# Patient Record
Sex: Male | Born: 1955 | Race: White | Hispanic: No | Marital: Married | Smoking: Never smoker
Health system: Southern US, Community
[De-identification: ages and names within clinical notes are randomized; demographics above are authoritative.]

## PROBLEM LIST (undated history)

## (undated) DIAGNOSIS — E8881 Metabolic syndrome: Secondary | ICD-10-CM

## (undated) DIAGNOSIS — G4730 Sleep apnea, unspecified: Secondary | ICD-10-CM

## (undated) DIAGNOSIS — E785 Hyperlipidemia, unspecified: Secondary | ICD-10-CM

## (undated) DIAGNOSIS — G4733 Obstructive sleep apnea (adult) (pediatric): Secondary | ICD-10-CM

## (undated) DIAGNOSIS — R0600 Dyspnea, unspecified: Secondary | ICD-10-CM

## (undated) DIAGNOSIS — I10 Essential (primary) hypertension: Secondary | ICD-10-CM

## (undated) HISTORY — DX: Metabolic syndrome: E88.81

## (undated) HISTORY — DX: Metabolic syndrome: E88.810

## (undated) HISTORY — DX: Obstructive sleep apnea (adult) (pediatric): G47.33

## (undated) HISTORY — DX: Hyperlipidemia, unspecified: E78.5

## (undated) HISTORY — DX: Essential (primary) hypertension: I10

## (undated) HISTORY — DX: Sleep apnea, unspecified: G47.30

## (undated) HISTORY — DX: Dyspnea, unspecified: R06.00

---

## 1990-02-20 HISTORY — PX: SPINE SURGERY: SHX786

## 1990-02-20 HISTORY — PX: BACK SURGERY: SHX140

## 2005-12-06 ENCOUNTER — Ambulatory Visit: Payer: Self-pay | Admitting: Family Medicine

## 2005-12-13 ENCOUNTER — Ambulatory Visit: Payer: Self-pay | Admitting: Family Medicine

## 2006-02-28 ENCOUNTER — Ambulatory Visit: Payer: Self-pay | Admitting: Family Medicine

## 2007-08-07 ENCOUNTER — Ambulatory Visit: Payer: Self-pay | Admitting: Family Medicine

## 2007-08-14 ENCOUNTER — Ambulatory Visit: Payer: Self-pay | Admitting: Gastroenterology

## 2007-08-28 ENCOUNTER — Ambulatory Visit: Payer: Self-pay | Admitting: Gastroenterology

## 2010-03-30 ENCOUNTER — Institutional Professional Consult (permissible substitution) (INDEPENDENT_AMBULATORY_CARE_PROVIDER_SITE_OTHER): Payer: BC Managed Care – PPO | Admitting: Cardiovascular Disease

## 2010-03-30 DIAGNOSIS — G4700 Insomnia, unspecified: Secondary | ICD-10-CM

## 2010-03-30 DIAGNOSIS — G4730 Sleep apnea, unspecified: Secondary | ICD-10-CM

## 2010-03-30 DIAGNOSIS — I119 Hypertensive heart disease without heart failure: Secondary | ICD-10-CM

## 2010-05-03 ENCOUNTER — Ambulatory Visit (HOSPITAL_BASED_OUTPATIENT_CLINIC_OR_DEPARTMENT_OTHER): Payer: BC Managed Care – PPO | Attending: Cardiovascular Disease

## 2010-05-03 DIAGNOSIS — G4710 Hypersomnia, unspecified: Secondary | ICD-10-CM | POA: Insufficient documentation

## 2010-05-03 DIAGNOSIS — G4733 Obstructive sleep apnea (adult) (pediatric): Secondary | ICD-10-CM | POA: Insufficient documentation

## 2010-05-03 DIAGNOSIS — R0989 Other specified symptoms and signs involving the circulatory and respiratory systems: Secondary | ICD-10-CM | POA: Insufficient documentation

## 2010-05-03 DIAGNOSIS — R0609 Other forms of dyspnea: Secondary | ICD-10-CM | POA: Insufficient documentation

## 2010-05-03 DIAGNOSIS — I4949 Other premature depolarization: Secondary | ICD-10-CM | POA: Insufficient documentation

## 2010-05-07 DIAGNOSIS — G4733 Obstructive sleep apnea (adult) (pediatric): Secondary | ICD-10-CM

## 2010-05-07 DIAGNOSIS — R0989 Other specified symptoms and signs involving the circulatory and respiratory systems: Secondary | ICD-10-CM

## 2010-05-07 DIAGNOSIS — G4710 Hypersomnia, unspecified: Secondary | ICD-10-CM

## 2010-05-07 DIAGNOSIS — R0609 Other forms of dyspnea: Secondary | ICD-10-CM

## 2010-06-01 ENCOUNTER — Telehealth: Payer: Self-pay | Admitting: Cardiovascular Disease

## 2010-06-01 NOTE — Telephone Encounter (Signed)
CALL PT REGARDING A TEST HE HAD DONE.

## 2010-06-02 ENCOUNTER — Telehealth: Payer: Self-pay | Admitting: Cardiovascular Disease

## 2010-06-02 NOTE — Telephone Encounter (Signed)
Pt called with app for F/U to abn sleep study results, study faxed to Dr Shelle Iron for app 06/29/10 11:30 am.Jodette Mason Jim RN

## 2010-06-23 ENCOUNTER — Encounter: Payer: Self-pay | Admitting: Cardiovascular Disease

## 2010-06-23 DIAGNOSIS — R0600 Dyspnea, unspecified: Secondary | ICD-10-CM | POA: Insufficient documentation

## 2010-06-23 DIAGNOSIS — I10 Essential (primary) hypertension: Secondary | ICD-10-CM | POA: Insufficient documentation

## 2010-06-23 DIAGNOSIS — R079 Chest pain, unspecified: Secondary | ICD-10-CM | POA: Insufficient documentation

## 2010-06-29 ENCOUNTER — Encounter: Payer: Self-pay | Admitting: Cardiovascular Disease

## 2010-06-29 ENCOUNTER — Ambulatory Visit (INDEPENDENT_AMBULATORY_CARE_PROVIDER_SITE_OTHER): Payer: BC Managed Care – PPO | Admitting: Pulmonary Disease

## 2010-06-29 ENCOUNTER — Telehealth: Payer: Self-pay | Admitting: *Deleted

## 2010-06-29 ENCOUNTER — Encounter: Payer: Self-pay | Admitting: Pulmonary Disease

## 2010-06-29 ENCOUNTER — Ambulatory Visit (INDEPENDENT_AMBULATORY_CARE_PROVIDER_SITE_OTHER): Payer: BC Managed Care – PPO | Admitting: Cardiovascular Disease

## 2010-06-29 VITALS — BP 144/82 | HR 70 | Temp 98.3°F | Ht 68.5 in | Wt 204.6 lb

## 2010-06-29 VITALS — BP 120/88 | HR 78 | Wt 201.0 lb

## 2010-06-29 DIAGNOSIS — I10 Essential (primary) hypertension: Secondary | ICD-10-CM

## 2010-06-29 DIAGNOSIS — G4733 Obstructive sleep apnea (adult) (pediatric): Secondary | ICD-10-CM

## 2010-06-29 LAB — BASIC METABOLIC PANEL
BUN: 20 mg/dL (ref 6–23)
CO2: 30 mEq/L (ref 19–32)
Creatinine, Ser: 0.9 mg/dL (ref 0.4–1.5)
Glucose, Bld: 97 mg/dL (ref 70–99)
Potassium: 4.1 mEq/L (ref 3.5–5.1)
Sodium: 137 mEq/L (ref 135–145)

## 2010-06-29 NOTE — Assessment & Plan Note (Signed)
Tyrone Jefferson is doing very well from a cardiac standpoint. He's tolerating his blood pressure pills fairly well. He occasionally holds his HCTZ if he has To drop a long distance. We'll check a basic metabolic profile today. I'll see him in 6 months for an office visit and BMP.

## 2010-06-29 NOTE — Patient Instructions (Signed)
Consider trial of weight loss over next 6mos vs dental appliance or cpap.  Please call and let me know what you decide.

## 2010-06-29 NOTE — Telephone Encounter (Signed)
Patient called with lab results.normal Alfonso Ramus RN

## 2010-06-29 NOTE — Progress Notes (Signed)
  Subjective:    Patient ID: Tyrone Jefferson, male    DOB: 24-Nov-1955, 55 y.o.   MRN: [id number]  HPI The pt is a 55y/o male who I have been asked to see for management of osa.  He recently underwent split night study where he was found to have mild to mod osa with AHI 20/hr and optimal cpap pressure of 9cm.  The pt's history is significant for the following: -loud snoring and an abnormal breathing pattern during sleep -frequent awakenings, and 60% of time unrested upon awakening. -stays too busy at work to experience sleepiness, but dozes in evening with tv/movies -denies sleepiness with driving. -weight neutral over last 2 yrs.   Sleep Questionnaire: What time do you typically go to bed?( Between what hours) 8pm How long does it take you to fall asleep? 5 to 15 mins How many times during the night do you wake up? 3 What time do you get out of bed to start your day? 0300 Do you drive or operate heavy machinery in your occupation? Yes How much has your weight changed (up or down) over the past two years? (In pounds) 5 lb (2.268 kg) Have you ever had a sleep study before? Yes If yes, location of study? [facility] If yes, date of study? 05/03/2010 Do you currently use CPAP? No Do you wear oxygen at any time? No  Review of Systems  Constitutional: Negative for fever and unexpected weight change.  HENT: Negative for ear pain, nosebleeds, congestion, sore throat, rhinorrhea, sneezing, trouble swallowing, dental problem, postnasal drip and sinus pressure.   Eyes: Negative for redness and itching.  Respiratory: Negative for cough, chest tightness, shortness of breath and wheezing.   Cardiovascular: Negative for palpitations and leg swelling.  Gastrointestinal: Negative for nausea and vomiting.  Genitourinary: Negative for dysuria.  Musculoskeletal: Negative for joint swelling.  Skin: Negative for rash.  Neurological: Negative for headaches.  Hematological: Does not bruise/bleed easily.    Psychiatric/Behavioral: Negative for dysphoric mood. The patient is not nervous/anxious.        Objective:   Physical Exam Constitutional:  Well developed, no acute distress  HENT:  Nares patent without discharge, but septum is deviated to left.  Oropharynx without exudate, palate and uvula are moderately elongated.  Eyes:  Perrla, eomi, no scleral icterus  Neck:  No JVD, no TMG  Cardiovascular:  Normal rate, regular rhythm, no rubs or gallops.  No murmurs        Intact distal pulses  Pulmonary :  Normal breath sounds, no stridor or respiratory distress   No rales, rhonchi, or wheezing  Abdominal:  Soft, nondistended, bowel sounds present.  No tenderness noted.   Musculoskeletal:  No lower extremity edema noted.  Lymph Nodes:  No cervical lymphadenopathy noted  Skin:  No cyanosis noted  Neurologic:  Alert, appropriate, moves all 4 extremities without obvious deficit.         Assessment & Plan:

## 2010-06-29 NOTE — Progress Notes (Signed)
Tyrone Jefferson Date of Birth  04-Nov-1955 [facility] [address]     [address] [place], [place]  [address] [phone]  Fax  [phone]  History of Present Illness:  Doing well. Tolerating the meds without any problems.  Slight dizziness after first starting the meds but now doing well.  Current Outpatient Prescriptions on File Prior to Visit  Medication Sig Dispense Refill  . hydrochlorothiazide 25 MG tablet Take 25 mg by mouth daily.        Marland Kitchen lisinopril (PRINIVIL,ZESTRIL) 20 MG tablet Take 20 mg by mouth daily.        . potassium chloride (KLOR-CON) 10 MEQ CR tablet Take 10 mEq by mouth daily.          No Known Allergies  Past Medical History  Diagnosis Date  . Hypertension   . Chest pain   . Dyspnea     Past Surgical History  Procedure Date  . Back surgery     History  Smoking status  . Never Smoker   Smokeless tobacco  . Not on file    History  Alcohol Use No    Family History  Problem Relation Age of Onset  . Heart attack Mother 35  . Hypertension Mother   . Diabetes Mother   . Heart disease Father 24    Reviw of Systems:  Reviewed in the HPI.  All other systems are negative.  Physical Exam: BP 120/88  Pulse 78  Wt 201 lb (91.173 kg) The patient is alert and oriented x 3.  The mood and affect are normal.  The skin is warm and dry.  Color is normal.  The HEENT exam reveals that the sclera are nonicteric.  The mucous membranes are moist.  The carotids are 2+ without bruits.  There is no thyromegaly.  There is no JVD.  The lungs are clear.  The chest wall is non tender.  The heart exam reveals a regular rate with a normal S1 and S2.  There are no murmurs, gallops, or rubs.  The PMI is not displaced.   Abdominal exam reveals good bowel sounds.  There is no guarding or rebound.  There is no hepatosplenomegaly or tenderness.  There are no masses.  Exam of the legs reveal no clubbing, cyanosis, or edema.  The  legs are without rashes.  The distal pulses are intact.  Cranial nerves II - XII are intact.  Motor and sensory functions are intact.  The gait is normal.  Assessment / Plan:

## 2010-06-29 NOTE — Telephone Encounter (Signed)
Message copied by Mahalia Longest on Wed Jun 29, 2010  4:56 PM ------      Message from: [place], [place]      Created: Wed Jun 29, 2010  3:32 PM       Normal

## 2010-06-29 NOTE — Assessment & Plan Note (Signed)
The pt has moderate osa by his recent sleep study, but is not overly symptomatic, nor does he have significant CV disease.  I have outlined a conservative path with a trial of weight loss over the next 6mos vs a more aggressive approach with surgery, dental appliance, cpap.  He is unsure if he can lose significant weight, and is considering dental appliance and cpap.  He would like to take some time to think about this decision, and will get back with me after discussing further with his wife.  I have given them a handout on dental appliance, and encouraged them to do research on internet about osa and cpap as well.

## 2011-03-09 ENCOUNTER — Encounter: Payer: Self-pay | Admitting: Cardiovascular Disease

## 2011-03-09 ENCOUNTER — Ambulatory Visit (INDEPENDENT_AMBULATORY_CARE_PROVIDER_SITE_OTHER): Payer: BC Managed Care – PPO | Admitting: Cardiovascular Disease

## 2011-03-09 DIAGNOSIS — R079 Chest pain, unspecified: Secondary | ICD-10-CM

## 2011-03-09 DIAGNOSIS — G4733 Obstructive sleep apnea (adult) (pediatric): Secondary | ICD-10-CM

## 2011-03-09 DIAGNOSIS — I10 Essential (primary) hypertension: Secondary | ICD-10-CM

## 2011-03-09 MED ORDER — LISINOPRIL 20 MG PO TABS: 20.0000 mg | ORAL_TABLET | Freq: Every day | ORAL | Status: DC

## 2011-03-09 MED ORDER — HYDROCHLOROTHIAZIDE 25 MG PO TABS: 25.0000 mg | ORAL_TABLET | Freq: Every day | ORAL | Status: DC

## 2011-03-09 NOTE — Patient Instructions (Signed)
Your physician wants you to follow-up in: 1 YEAR You will receive a reminder letter in the mail two months in advance. If you don't receive a letter, please call our office to schedule the follow-up appointment.  Your physician recommends that you return for a FASTING lipid profile: TODAY AND 1 YEAR UNLESS MEDS ARE ADJUSTED  REFILLS COMPLETED

## 2011-03-09 NOTE — Assessment & Plan Note (Signed)
Further management by Dr. Shelle Iron

## 2011-03-09 NOTE — Progress Notes (Signed)
    Tyrone Jefferson Date of Birth  19-Feb-1956 [name]     [name]  [address]    [address]   [address] [name], [place]  [address]    [place], [place]  [address] [phone]  Fax  [phone]  [phone]  Fax [phone]   History of Present Illness:  Tyrone Jefferson is a 56 yo with a hx of HTN and chest pain.  He's done very well since I saw him last year. He's not had any episodes of chest discomfort. He had a sleep study performed last year which reveals moderate obstructive sleep apnea. He had a nadir O2 saturation of 85% on room air. He had an apnea/hypoxia index of 20 per hour. With CPAP titration he was able to keep his O2 saturations 93%.  He's done fairly well from a cardiac template he's not had any episodes of chest pain or shortness of breath.   Current Outpatient Prescriptions on File Prior to Visit  Medication Sig Dispense Refill  . hydrochlorothiazide 25 MG tablet Take 25 mg by mouth daily.        Marland Kitchen lisinopril (PRINIVIL,ZESTRIL) 20 MG tablet Take 20 mg by mouth daily.          No Known Allergies  Past Medical History  Diagnosis Date  . Hypertension   . Chest pain   . Dyspnea   . Hyperlipidemia     Past Surgical History  Procedure Date  . Back surgery 1992    History  Smoking status  . Never Smoker   Smokeless tobacco  . Not on file    History  Alcohol Use No    Family History  Problem Relation Age of Onset  . Heart attack Mother 16  . Hypertension Mother   . Diabetes Mother   . Heart disease Father 73  . Brain cancer Mother   . Colon cancer Father     Reviw of Systems:  Reviewed in the HPI.  All other systems are negative.  Physical Exam: BP 119/79  Pulse 62  Ht 5\' 8"  (1.727 m)  Wt 199 lb 12.8 oz (90.629 kg)  BMI 30.38 kg/m2 The patient is alert and oriented x 3.  The mood and affect are normal.   Skin: warm and dry.  Color is normal.    HEENT:   Normocephalic/atraumatic. His no carotid bruits. No  JVD  Lungs: Lungs are clear.   Heart: Regular rate S1-S2.    Abdomen: Abdominal exam reveals good bowel sounds.  Extremities:  No clubbing cyanosis or edema the  Neuro:  There exam is nonfocal.    ECG: Normal sinus rhythm. He has no EKG changes.  Assessment / Plan:

## 2011-03-09 NOTE — Assessment & Plan Note (Signed)
His blood pressure remains very well controlled. Will continue the same medications.

## 2011-03-10 LAB — HEPATIC FUNCTION PANEL
ALT: 21 U/L (ref 0–53)
Alkaline Phosphatase: 49 U/L (ref 39–117)
Bilirubin, Direct: 0.1 mg/dL (ref 0.0–0.3)
Total Bilirubin: 0.5 mg/dL (ref 0.3–1.2)
Total Protein: 7 g/dL (ref 6.0–8.3)

## 2011-03-10 LAB — BASIC METABOLIC PANEL
BUN: 22 mg/dL (ref 6–23)
CO2: 30 mEq/L (ref 19–32)
Calcium: 8.6 mg/dL (ref 8.4–10.5)
GFR: 68.14 mL/min (ref >60.00)
Glucose, Bld: 92 mg/dL (ref 70–99)

## 2011-03-10 LAB — LIPID PANEL: VLDL: 35.6 mg/dL (ref 0.0–40.0)

## 2011-03-14 ENCOUNTER — Other Ambulatory Visit: Payer: Self-pay | Admitting: *Deleted

## 2011-03-14 DIAGNOSIS — E785 Hyperlipidemia, unspecified: Secondary | ICD-10-CM

## 2011-03-14 MED ORDER — ATORVASTATIN CALCIUM 20 MG PO TABS: 20.0000 mg | ORAL_TABLET | Freq: Every day | ORAL | Status: DC

## 2011-03-14 NOTE — Telephone Encounter (Signed)
Lab results given lab redraw set, script sent.

## 2011-04-28 ENCOUNTER — Other Ambulatory Visit: Payer: Self-pay | Admitting: *Deleted

## 2011-04-28 NOTE — Telephone Encounter (Signed)
Opened in Error.

## 2011-05-02 ENCOUNTER — Other Ambulatory Visit: Payer: Self-pay

## 2011-05-02 MED ORDER — LISINOPRIL 20 MG PO TABS: 20.0000 mg | ORAL_TABLET | Freq: Every day | ORAL | Status: DC

## 2011-06-15 ENCOUNTER — Other Ambulatory Visit: Payer: BC Managed Care – PPO

## 2011-10-12 ENCOUNTER — Ambulatory Visit (INDEPENDENT_AMBULATORY_CARE_PROVIDER_SITE_OTHER): Payer: BC Managed Care – PPO | Admitting: Family Medicine

## 2011-10-12 ENCOUNTER — Encounter: Payer: Self-pay | Admitting: Family Medicine

## 2011-10-12 VITALS — BP 110/78 | Temp 98.6°F | Ht 69.5 in | Wt 201.0 lb

## 2011-10-12 DIAGNOSIS — Z0000 Encounter for general adult medical examination without abnormal findings: Secondary | ICD-10-CM

## 2011-10-12 DIAGNOSIS — Z23 Encounter for immunization: Secondary | ICD-10-CM

## 2011-10-12 LAB — LIPID PANEL
Total CHOL/HDL Ratio: 5
VLDL: 31.6 mg/dL (ref 0.0–40.0)

## 2011-10-12 LAB — POCT URINALYSIS DIPSTICK
Bilirubin, UA: NEGATIVE
Glucose, UA: NEGATIVE
Ketones, UA: NEGATIVE
Leukocytes, UA: NEGATIVE
Protein, UA: NEGATIVE
Spec Grav, UA: 1.025

## 2011-10-12 LAB — HEPATIC FUNCTION PANEL
Alkaline Phosphatase: 50 U/L (ref 39–117)
Bilirubin, Direct: 0.2 mg/dL (ref 0.0–0.3)
Total Bilirubin: 0.8 mg/dL (ref 0.3–1.2)

## 2011-10-12 LAB — CBC WITH DIFFERENTIAL/PLATELET
Basophils Absolute: 0 10*3/uL (ref 0.0–0.1)
Basophils Relative: 0.3 % (ref 0.0–3.0)
Eosinophils Relative: 0.6 % (ref 0.0–5.0)
HCT: 48.7 % (ref 39.0–52.0)
Hemoglobin: 16.2 g/dL (ref 13.0–17.0)
Lymphocytes Relative: 23.6 % (ref 12.0–46.0)
Monocytes Relative: 10 % (ref 3.0–12.0)
Neutro Abs: 5.5 10*3/uL (ref 1.4–7.7)
RBC: 5.36 Mil/uL (ref 4.22–5.81)
WBC: 8.4 10*3/uL (ref 4.5–10.5)

## 2011-10-12 LAB — BASIC METABOLIC PANEL
BUN: 22 mg/dL (ref 6–23)
Chloride: 97 mEq/L (ref 96–112)
Creatinine, Ser: 0.9 mg/dL (ref 0.4–1.5)
Glucose, Bld: 90 mg/dL (ref 70–99)
Potassium: 4.1 mEq/L (ref 3.5–5.1)

## 2011-10-12 LAB — TSH: TSH: 2.22 u[IU]/mL (ref 0.35–5.50)

## 2011-10-12 LAB — LDL CHOLESTEROL, DIRECT: Direct LDL: 151.7 mg/dL

## 2011-10-12 NOTE — Patient Instructions (Addendum)
Try to establish more consistent exercise. 

## 2011-10-12 NOTE — Progress Notes (Signed)
  Subjective:    Patient ID: Tyrone Jefferson, male    DOB: 09-15-1955, 56 y.o.   MRN: [id number]  HPI  New to establish care. Patient has past medical history of hypertension, hyperlipidemia, and obstructive sleep apnea. Blood pressures been well controlled. Medications reviewed. Previously on Lipitor but stopped this on his own. He does not recall any side effects.  Obstructive sleep apnea was mild and he has controlled with weight management. Has never used CPAP. He had previous surgery for lumbosacral discectomy L4-L5.  Family history father colon cancer and mother with history of heart disease age 56 and type 2 diabetes.  Patient's last tetanus unknown and estimated over 8 years and possibly over 10. Previous colonoscopy 2009   Review of Systems  Constitutional: Negative for fever, activity change, appetite change, fatigue and unexpected weight change.  HENT: Negative for ear pain, congestion and trouble swallowing.   Eyes: Negative for pain and visual disturbance.  Respiratory: Negative for cough, shortness of breath and wheezing.   Cardiovascular: Negative for chest pain and palpitations.  Gastrointestinal: Negative for nausea, vomiting, abdominal pain, diarrhea, constipation, blood in stool, abdominal distention and rectal pain.  Genitourinary: Negative for dysuria, hematuria and testicular pain.  Musculoskeletal: Negative for joint swelling and arthralgias.  Skin: Negative for rash.  Neurological: Negative for dizziness, syncope, weakness and headaches.  Hematological: Negative for adenopathy.  Psychiatric/Behavioral: Negative for confusion and dysphoric mood.       Objective:   Physical Exam  Constitutional: He is oriented to person, place, and time. He appears well-developed and well-nourished. No distress.  HENT:  Head: Normocephalic and atraumatic.  Right Ear: External ear normal.  Left Ear: External ear normal.  Mouth/Throat: Oropharynx is clear and moist.  Eyes:  Conjunctivae and EOM are normal. Pupils are equal, round, and reactive to light.  Neck: Normal range of motion. Neck supple. No thyromegaly present.  Cardiovascular: Normal rate, regular rhythm and normal heart sounds.   No murmur heard. Pulmonary/Chest: No respiratory distress. He has no wheezes. He has no rales.  Abdominal: Soft. Bowel sounds are normal. He exhibits no distension and no mass. There is no tenderness. There is no rebound and no guarding.  Genitourinary: Rectum normal and prostate normal.  Musculoskeletal: He exhibits no edema.  Lymphadenopathy:    He has no cervical adenopathy.  Neurological: He is alert and oriented to person, place, and time. He displays normal reflexes. No cranial nerve deficit.  Skin: No rash noted.  Psychiatric: He has a normal mood and affect.          Assessment & Plan:  Complete physical exam. Tetanus given. Obtain screening labs. Establish regular exercise. Colonoscopy up to date.

## 2011-10-16 ENCOUNTER — Telehealth: Payer: Self-pay | Admitting: Family Medicine

## 2011-10-16 NOTE — Telephone Encounter (Signed)
Pt informed this afternoon

## 2011-10-16 NOTE — Telephone Encounter (Signed)
Pt called req to get lab results. Pls call.  °

## 2012-03-25 ENCOUNTER — Other Ambulatory Visit: Payer: Self-pay

## 2012-03-25 MED ORDER — LISINOPRIL 20 MG PO TABS: 20.0000 mg | ORAL_TABLET | Freq: Every day | ORAL | Status: DC

## 2012-03-25 MED ORDER — HYDROCHLOROTHIAZIDE 25 MG PO TABS: 25.0000 mg | ORAL_TABLET | Freq: Every day | ORAL | Status: DC

## 2012-05-28 ENCOUNTER — Other Ambulatory Visit: Payer: Self-pay | Admitting: *Deleted

## 2012-05-28 MED ORDER — HYDROCHLOROTHIAZIDE 25 MG PO TABS: 25.0000 mg | ORAL_TABLET | Freq: Every day | ORAL | Status: DC

## 2012-05-28 MED ORDER — LISINOPRIL 20 MG PO TABS: 20.0000 mg | ORAL_TABLET | Freq: Every day | ORAL | Status: DC

## 2012-05-28 NOTE — Telephone Encounter (Signed)
Fax Received. Refill Completed. Tyrone Jefferson (R.M.A)   

## 2012-05-29 ENCOUNTER — Ambulatory Visit (INDEPENDENT_AMBULATORY_CARE_PROVIDER_SITE_OTHER): Payer: BC Managed Care – PPO | Admitting: Physician Assistant

## 2012-05-29 ENCOUNTER — Encounter: Payer: Self-pay | Admitting: Physician Assistant

## 2012-05-29 VITALS — BP 118/72 | HR 80 | Ht 68.0 in | Wt 204.0 lb

## 2012-05-29 DIAGNOSIS — E785 Hyperlipidemia, unspecified: Secondary | ICD-10-CM | POA: Insufficient documentation

## 2012-05-29 DIAGNOSIS — G4733 Obstructive sleep apnea (adult) (pediatric): Secondary | ICD-10-CM

## 2012-05-29 DIAGNOSIS — I10 Essential (primary) hypertension: Secondary | ICD-10-CM

## 2012-05-29 MED ORDER — PRAVASTATIN SODIUM 20 MG PO TABS: 20.0000 mg | ORAL_TABLET | Freq: Every evening | ORAL | Status: DC

## 2012-05-29 MED ORDER — ASPIRIN EC 81 MG PO TBEC: 81.0000 mg | DELAYED_RELEASE_TABLET | Freq: Every day | ORAL | Status: DC

## 2012-05-29 MED ORDER — HYDROCHLOROTHIAZIDE 25 MG PO TABS: 25.0000 mg | ORAL_TABLET | Freq: Every day | ORAL | Status: DC

## 2012-05-29 MED ORDER — LISINOPRIL 20 MG PO TABS: 20.0000 mg | ORAL_TABLET | Freq: Every day | ORAL | Status: DC

## 2012-05-29 NOTE — Progress Notes (Signed)
  [address], [address] [place], [place]  [address] Phone: [phone] Fax:  [phone]  Date:  05/29/2012   ID:  Tyrone Jefferson, Tyrone Jefferson 09/29/1955, MRN [id number]  PCP:  Kristian Covey, MD  Primary Cardiologist:  Dr. Delane Ginger     History of Present Illness: Tyrone Jefferson is a 57 y.o. male who returns for followup.  He has a history of HTN, HL, OSA.  Last seen by Dr. Elease Hashimoto 1/13.  The patient denies chest pain, shortness of breath, syncope, orthopnea, PND or significant pedal edema.    Labs (8/13):  K 4.1, Cr 0.9, ALT 22, LDL 152  Wt Readings from Last 3 Encounters:  05/29/12 204 lb (92.534 kg)  10/12/11 201 lb (91.173 kg)  03/09/11 199 lb 12.8 oz (90.629 kg)     Past Medical History  Diagnosis Date  . Hypertension   . Chest pain   . Dyspnea   . Hyperlipidemia   . OSA (obstructive sleep apnea)     Current Outpatient Prescriptions  Medication Sig Dispense Refill  . hydrochlorothiazide (HYDRODIURIL) 25 MG tablet Take 1 tablet (25 mg total) by mouth daily.  30 tablet  3  . lisinopril (PRINIVIL,ZESTRIL) 20 MG tablet Take 1 tablet (20 mg total) by mouth daily.  30 tablet  3   No current facility-administered medications for this visit.    Allergies:   No Known Allergies  Social History:  The patient  reports that he has never smoked. He does not have any smokeless tobacco history on file. He reports that he does not drink alcohol or use illicit drugs.   Family History:  The patient's family history includes Brain cancer in his mother; Colon cancer in his father; Diabetes in his mother; Heart attack (age of onset: 14) in his mother; Heart disease (age of onset: 3) in his father; and Hypertension in his mother.   ROS:  Please see the history of present illness.   He has some sinus congestion today.   All other systems reviewed and negative.   PHYSICAL EXAM: VS:  BP 118/72  Pulse 80  Ht 5\' 8"  (1.727 m)  Wt 204 lb (92.534 kg)  BMI 31.03 kg/m2 Well  nourished, well developed, in no acute distress HEENT: normal Neck: no JVD Cardiac:  normal S1, S2; RRR; no murmur Lungs:  clear to auscultation bilaterally, no wheezing, rhonchi or rales Abd: soft, nontender, no hepatomegaly Ext: no edema Skin: warm and dry Neuro:  CNs 2-12 intact, no focal abnormalities noted  EKG:  NSR, HR 80, normal axis, PACs, poor R wave progression, no change from prior tracing    ASSESSMENT AND PLAN:  1. Hypertension:  Controlled.  Continue current therapy. Check BMET with next blood draw.  2. Hyperlipidemia:  10 year CV risk is 7.5%  He has been intolerant to statins in the past.  He has not tried Pravastatin.  I have asked him to take Pravastatin 20 mg QHS on MWF only.  I have also advised him to take ASA 81 mg QD.  Check Lipids and LFTs in 3 months.   3. Disposition:  F/u with Dr. Delane Ginger in 1 year.   Luna Glasgow, PA-C  10:55 AM 05/29/2012

## 2012-05-29 NOTE — Patient Instructions (Addendum)
Your prescription for Pravastatin ( 20 mg)  states to take daily.  You can just take it on Mondays, Wednesdays and Fridays. Your physician has recommended you make the following change in your medication: START TAKING ASPIRIN ( 81 MG) DAILY WE REFILLED YOUR LINSINIPRIL AND HCTZ TODAY  Your physician recommends that you return for a FASTING lipid profile/LFTS/BMET IN 3 MONTHS  Your physician wants you to follow-up in: 1 YEAR You will receive a reminder letter in the mail two months in advance. If you don't receive a letter, please call our office to schedule the follow-up appointment. 2

## 2012-08-23 ENCOUNTER — Emergency Department (HOSPITAL_BASED_OUTPATIENT_CLINIC_OR_DEPARTMENT_OTHER)
Admission: EM | Admit: 2012-08-23 | Discharge: 2012-08-23 | Disposition: A | Discharge status: Home / Self Care | Payer: BC Managed Care – PPO | Attending: Emergency Medicine | Admitting: Emergency Medicine

## 2012-08-23 ENCOUNTER — Encounter (HOSPITAL_BASED_OUTPATIENT_CLINIC_OR_DEPARTMENT_OTHER): Payer: Self-pay

## 2012-08-23 DIAGNOSIS — Y929 Unspecified place or not applicable: Secondary | ICD-10-CM | POA: Insufficient documentation

## 2012-08-23 DIAGNOSIS — W298XXA Contact with other powered powered hand tools and household machinery, initial encounter: Secondary | ICD-10-CM | POA: Insufficient documentation

## 2012-08-23 DIAGNOSIS — Z8669 Personal history of other diseases of the nervous system and sense organs: Secondary | ICD-10-CM | POA: Insufficient documentation

## 2012-08-23 DIAGNOSIS — IMO0002 Reserved for concepts with insufficient information to code with codable children: Secondary | ICD-10-CM

## 2012-08-23 DIAGNOSIS — Z8679 Personal history of other diseases of the circulatory system: Secondary | ICD-10-CM | POA: Insufficient documentation

## 2012-08-23 DIAGNOSIS — Z79899 Other long term (current) drug therapy: Secondary | ICD-10-CM | POA: Insufficient documentation

## 2012-08-23 DIAGNOSIS — E785 Hyperlipidemia, unspecified: Secondary | ICD-10-CM | POA: Insufficient documentation

## 2012-08-23 DIAGNOSIS — Y9389 Activity, other specified: Secondary | ICD-10-CM | POA: Insufficient documentation

## 2012-08-23 DIAGNOSIS — I10 Essential (primary) hypertension: Secondary | ICD-10-CM | POA: Insufficient documentation

## 2012-08-23 DIAGNOSIS — S61209A Unspecified open wound of unspecified finger without damage to nail, initial encounter: Secondary | ICD-10-CM | POA: Insufficient documentation

## 2012-08-23 NOTE — ED Notes (Signed)
MD at bedside. 

## 2012-08-23 NOTE — ED Notes (Signed)
Patient here with laceration to left hand middle finger after cutting hedges this am with electric clippers. Bleeding on arrival and saline dressing applied, no deficits of digit on exam.

## 2012-08-23 NOTE — ED Provider Notes (Signed)
History    CSN: [id number] Arrival date & time 08/23/12  1002  First MD Initiated Contact with Patient 08/23/12 1018     No chief complaint on file.  (Consider location/radiation/quality/duration/timing/severity/associated sxs/prior Treatment) HPI Comments: Patient presents a laceration to his left middle finger. He states he cut it with some hedge trimmer first earlier this morning. He often constant throbbing pain to the area but no other injuries. His last tetanus shot was within the last 5 years.  Past Medical History  Diagnosis Date  . Hypertension   . Chest pain   . Dyspnea   . Hyperlipidemia   . OSA (obstructive sleep apnea)    Past Surgical History  Procedure Laterality Date  . Back surgery  1992  . Spine surgery  1992    L4-5 discectomy   Family History  Problem Relation Age of Onset  . Heart attack Mother 56  . Hypertension Mother   . Diabetes Mother   . Heart disease Father 38  . Brain cancer Mother   . Colon cancer Father    History  Substance Use Topics  . Smoking status: Never Smoker   . Smokeless tobacco: Not on file  . Alcohol Use: No    Review of Systems  Constitutional: Negative for fever.  HENT: Negative for neck pain.   Respiratory: Negative.   Cardiovascular: Negative.   Gastrointestinal: Negative for nausea and vomiting.  Musculoskeletal: Negative for back pain and joint swelling.  Skin: Positive for wound.  Neurological: Negative for headaches.    Allergies  Review of patient's allergies indicates no known allergies.  Home Medications   Current Outpatient Rx  Name  Route  Sig  Dispense  Refill  . hydrochlorothiazide (HYDRODIURIL) 25 MG tablet   Oral   Take 1 tablet (25 mg total) by mouth daily.   30 tablet   11   . lisinopril (PRINIVIL,ZESTRIL) 20 MG tablet   Oral   Take 1 tablet (20 mg total) by mouth daily.   30 tablet   11   . pravastatin (PRAVACHOL) 20 MG tablet   Oral   Take 1 tablet (20 mg total) by mouth every  evening.   30 tablet   11    BP 132/86  Pulse 79  Temp(Src) 98.3 F (36.8 C) (Oral)  Resp 18  Ht 5\' 9"  (1.753 m)  Wt 200 lb (90.719 kg)  BMI 29.52 kg/m2  SpO2 96% Physical Exam  Constitutional: He is oriented to person, place, and time. He appears well-developed and well-nourished.  HENT:  Head: Normocephalic and atraumatic.  Neck: Normal range of motion. Neck supple.  No pain to neck or back  Musculoskeletal: He exhibits no edema and no tenderness.  Neurological: He is alert and oriented to person, place, and time.  Skin: Skin is warm and dry.  Patient has a 1 cm laceration to the volar surface of the left middle finger overlying the dorsal phalanx. There is no active bleeding. Edges are well approximated. He is able flex and extend the finger without problem. He has normal sensation distally to light touch. Capillary refill is less than 2 distally.    ED Course  LACERATION REPAIR Date/Time: 08/23/2012 10:58 AM Performed by: Loralai Eisman Authorized by: Rolan Bucco Consent: Verbal consent obtained. Risks and benefits: risks, benefits and alternatives were discussed Body area: upper extremity Location details: left long finger Laceration length: 1 cm Tendon involvement: none Nerve involvement: none Vascular damage: no Patient sedated: no Preparation: Patient was  prepped and draped in the usual sterile fashion. Irrigation solution: saline Irrigation method: syringe Amount of cleaning: standard Skin closure: glue Approximation: close Approximation difficulty: simple Patient tolerance: Patient tolerated the procedure well with no immediate complications.   (including critical care time) Labs Reviewed - No data to display No results found. 1. Laceration     MDM  Was repaired with Dermabond. He was advised in wound care instructions. He was advised to return for any worsening symptoms or signs of infection. A finger splint was placed over the area for protection  since he has to go to work tomorrow.  Rolan Bucco, MD 08/23/12 1059

## 2012-08-25 ENCOUNTER — Emergency Department (HOSPITAL_BASED_OUTPATIENT_CLINIC_OR_DEPARTMENT_OTHER)
Admission: EM | Admit: 2012-08-25 | Discharge: 2012-08-25 | Disposition: A | Discharge status: Home / Self Care | Payer: BC Managed Care – PPO | Attending: Emergency Medicine | Admitting: Emergency Medicine

## 2012-08-25 ENCOUNTER — Encounter (HOSPITAL_BASED_OUTPATIENT_CLINIC_OR_DEPARTMENT_OTHER): Payer: Self-pay

## 2012-08-25 DIAGNOSIS — Z4801 Encounter for change or removal of surgical wound dressing: Secondary | ICD-10-CM | POA: Insufficient documentation

## 2012-08-25 DIAGNOSIS — Z5189 Encounter for other specified aftercare: Secondary | ICD-10-CM

## 2012-08-25 DIAGNOSIS — I10 Essential (primary) hypertension: Secondary | ICD-10-CM | POA: Insufficient documentation

## 2012-08-25 DIAGNOSIS — E785 Hyperlipidemia, unspecified: Secondary | ICD-10-CM | POA: Insufficient documentation

## 2012-08-25 DIAGNOSIS — Z8669 Personal history of other diseases of the nervous system and sense organs: Secondary | ICD-10-CM | POA: Insufficient documentation

## 2012-08-25 NOTE — ED Provider Notes (Signed)
   History    CSN: [id number] Arrival date & time 08/25/12  7829  First MD Initiated Contact with Patient 08/25/12 1005     Chief Complaint  Patient presents with  . Wound Check   HPI  Pt states that he is concerned about the dermabond that was used to cover his laceration to the L middle finger. Pt states that he has seen some clear and bloody drainage coming from the wound, he has kept it bandaged while working. Pt states that it is slightly puffy at this time. Pt presents with splint and coban in place  Past Medical History  Diagnosis Date  . Hypertension   . Chest pain   . Dyspnea   . Hyperlipidemia   . OSA (obstructive sleep apnea)    Past Surgical History  Procedure Laterality Date  . Back surgery  1992  . Spine surgery  1992    L4-5 discectomy   Family History  Problem Relation Age of Onset  . Heart attack Mother 66  . Hypertension Mother   . Diabetes Mother   . Heart disease Father 59  . Brain cancer Mother   . Colon cancer Father    History  Substance Use Topics  . Smoking status: Never Smoker   . Smokeless tobacco: Not on file  . Alcohol Use: No    Review of Systems All other systems reviewed and are negative Allergies  Review of patient's allergies indicates no known allergies.  Home Medications   Current Outpatient Rx  Name  Route  Sig  Dispense  Refill  . hydrochlorothiazide (HYDRODIURIL) 25 MG tablet   Oral   Take 1 tablet (25 mg total) by mouth daily.   30 tablet   11   . lisinopril (PRINIVIL,ZESTRIL) 20 MG tablet   Oral   Take 1 tablet (20 mg total) by mouth daily.   30 tablet   11   . pravastatin (PRAVACHOL) 20 MG tablet   Oral   Take 1 tablet (20 mg total) by mouth every evening.   30 tablet   11    BP 136/82  Pulse 86  Temp(Src) 98.4 F (36.9 C) (Oral)  Resp 17  Ht 5\' 9"  (1.753 m)  Wt 200 lb (90.719 kg)  BMI 29.52 kg/m2  SpO2 98% Physical Exam  Nursing note and vitals reviewed. Constitutional: He is oriented to  person, place, and time. He appears well-developed and well-nourished. No distress.  HENT:  Head: Normocephalic and atraumatic.  Eyes: Pupils are equal, round, and reactive to light.  Neck: Normal range of motion.  Cardiovascular: Normal rate and intact distal pulses.   Pulmonary/Chest: No respiratory distress.  Abdominal: Normal appearance. He exhibits no distension.  Musculoskeletal: Normal range of motion.       Hands: Neurological: He is alert and oriented to person, place, and time. No cranial nerve deficit.  Skin: Skin is warm and dry. No rash noted.  Psychiatric: He has a normal mood and affect. His behavior is normal.    ED Course  Procedures (including critical care time) Labs Reviewed - No data to display No results found. 1. Visit for wound check     MDM  The wound was redressed.  I did prescribe some Keflex if the patient notices purulent drainage or pain in swelling he is to get it filled.  Otherwise if he continues to heal well no prescriptions needed.  Nelia Shi, MD 08/25/12 [phone]

## 2012-08-25 NOTE — ED Notes (Signed)
Pt states that he is concerned about the dermabond that was used to cover his laceration to the L middle finger.  Pt states that he has seen some clear and bloody drainage coming from the wound, he has kept it bandaged while working. Pt states that it is slightly puffy at this time.  Pt presents with splint and coban in place.

## 2012-09-05 ENCOUNTER — Other Ambulatory Visit (INDEPENDENT_AMBULATORY_CARE_PROVIDER_SITE_OTHER): Payer: BC Managed Care – PPO

## 2012-09-05 DIAGNOSIS — E785 Hyperlipidemia, unspecified: Secondary | ICD-10-CM

## 2012-09-05 DIAGNOSIS — I10 Essential (primary) hypertension: Secondary | ICD-10-CM

## 2012-09-05 DIAGNOSIS — G4733 Obstructive sleep apnea (adult) (pediatric): Secondary | ICD-10-CM

## 2012-09-05 LAB — LDL CHOLESTEROL, DIRECT: Direct LDL: 152.8 mg/dL

## 2012-09-05 LAB — BASIC METABOLIC PANEL
BUN: 22 mg/dL (ref 6–23)
CO2: 29 mEq/L (ref 19–32)
Chloride: 99 mEq/L (ref 96–112)
Glucose, Bld: 112 mg/dL — ABNORMAL HIGH (ref 70–99)
Potassium: 3.7 mEq/L (ref 3.5–5.1)

## 2012-09-05 LAB — LIPID PANEL
Cholesterol: 213 mg/dL — ABNORMAL HIGH (ref 0–200)
Total CHOL/HDL Ratio: 5

## 2012-09-05 LAB — HEPATIC FUNCTION PANEL
ALT: 22 U/L (ref 0–53)
AST: 20 U/L (ref 0–37)
Total Bilirubin: 0.9 mg/dL (ref 0.3–1.2)

## 2012-09-06 ENCOUNTER — Other Ambulatory Visit: Payer: Self-pay | Admitting: *Deleted

## 2012-09-06 DIAGNOSIS — E785 Hyperlipidemia, unspecified: Secondary | ICD-10-CM

## 2012-09-09 ENCOUNTER — Telehealth: Payer: Self-pay | Admitting: *Deleted

## 2012-09-09 DIAGNOSIS — E785 Hyperlipidemia, unspecified: Secondary | ICD-10-CM

## 2012-09-09 NOTE — Telephone Encounter (Signed)
lmptcb about pravastatin ok per Lorin Picket to take M, W, F with follow up FLP.LFT in 3 months, asked for ptcb to verify message rec'vd

## 2012-09-09 NOTE — Telephone Encounter (Signed)
Message copied by Tarri Fuller on Mon Sep 09, 2012  4:44 PM ------      Message from: [place], [place] T      Created: Sun Sep 08, 2012  5:23 PM       Continue MWF and check Lipids and LFTs in 3 mos      Tereso Newcomer, [place]        09/08/2012 5:23 PM ------

## 2012-09-21 ENCOUNTER — Other Ambulatory Visit: Payer: Self-pay | Admitting: Cardiovascular Disease

## 2013-05-21 ENCOUNTER — Telehealth: Payer: Self-pay | Admitting: Cardiovascular Disease

## 2013-05-21 NOTE — Telephone Encounter (Signed)
New Message:  Just making Dr. Acie Fredrickson and Jodette aware... Today I called the pt to schedule a recall appt and pt stated he did not want to schedule an appt w/ Dr. Acie Fredrickson.. States he is having his PcP Burchette handle all of his care now and did not want to schedule a f/u appt w/ Nahser... I deleted the recall in his chart.Marland Kitchen

## 2013-05-21 NOTE — Telephone Encounter (Signed)
NOTED/ PT WAS SEEN PRIOR FOR HTN.

## 2013-06-19 ENCOUNTER — Other Ambulatory Visit: Payer: Self-pay

## 2013-06-19 ENCOUNTER — Other Ambulatory Visit: Payer: Self-pay | Admitting: Physician Assistant

## 2013-06-19 ENCOUNTER — Other Ambulatory Visit: Payer: Self-pay | Admitting: *Deleted

## 2013-06-19 DIAGNOSIS — I10 Essential (primary) hypertension: Secondary | ICD-10-CM

## 2013-06-19 DIAGNOSIS — E785 Hyperlipidemia, unspecified: Secondary | ICD-10-CM

## 2013-06-19 MED ORDER — HYDROCHLOROTHIAZIDE 25 MG PO TABS: 25.0000 mg | ORAL_TABLET | Freq: Every day | ORAL | Status: DC

## 2013-06-19 MED ORDER — LISINOPRIL 20 MG PO TABS: 20.0000 mg | ORAL_TABLET | Freq: Every day | ORAL | Status: DC

## 2013-07-22 ENCOUNTER — Ambulatory Visit (INDEPENDENT_AMBULATORY_CARE_PROVIDER_SITE_OTHER): Payer: BC Managed Care – PPO | Admitting: Cardiovascular Disease

## 2013-07-22 ENCOUNTER — Encounter (INDEPENDENT_AMBULATORY_CARE_PROVIDER_SITE_OTHER): Payer: Self-pay

## 2013-07-22 ENCOUNTER — Encounter: Payer: Self-pay | Admitting: Cardiovascular Disease

## 2013-07-22 VITALS — BP 128/86 | HR 57 | Ht 69.0 in | Wt 204.0 lb

## 2013-07-22 DIAGNOSIS — E785 Hyperlipidemia, unspecified: Secondary | ICD-10-CM

## 2013-07-22 DIAGNOSIS — R0600 Dyspnea, unspecified: Secondary | ICD-10-CM

## 2013-07-22 DIAGNOSIS — R0989 Other specified symptoms and signs involving the circulatory and respiratory systems: Secondary | ICD-10-CM

## 2013-07-22 DIAGNOSIS — R0609 Other forms of dyspnea: Secondary | ICD-10-CM

## 2013-07-22 DIAGNOSIS — I10 Essential (primary) hypertension: Secondary | ICD-10-CM

## 2013-07-22 LAB — HEPATIC FUNCTION PANEL
ALK PHOS: 49 U/L (ref 39–117)
ALT: 25 U/L (ref 0–53)
AST: 44 U/L — AB (ref 0–37)
Albumin: 4.1 g/dL (ref 3.5–5.2)
BILIRUBIN DIRECT: 0.1 mg/dL (ref 0.0–0.3)
Total Bilirubin: 0.8 mg/dL (ref 0.2–1.2)
Total Protein: 7 g/dL (ref 6.0–8.3)

## 2013-07-22 LAB — LIPID PANEL
CHOLESTEROL: 196 mg/dL (ref 0–200)
HDL: 40.2 mg/dL (ref >39.00)
LDL CALC: 126 mg/dL — AB (ref 0–99)
TRIGLYCERIDES: 150 mg/dL — AB (ref 0.0–149.0)
Total CHOL/HDL Ratio: 5
VLDL: 30 mg/dL (ref 0.0–40.0)

## 2013-07-22 LAB — BASIC METABOLIC PANEL
BUN: 22 mg/dL (ref 6–23)
CALCIUM: 8.9 mg/dL (ref 8.4–10.5)
CHLORIDE: 99 meq/L (ref 96–112)
CO2: 31 mEq/L (ref 19–32)
Creatinine, Ser: 0.9 mg/dL (ref 0.4–1.5)
GFR: 88.92 mL/min (ref >60.00)
Glucose, Bld: 97 mg/dL (ref 70–99)
Potassium: 3.7 mEq/L (ref 3.5–5.1)
Sodium: 136 mEq/L (ref 135–145)

## 2013-07-22 NOTE — Assessment & Plan Note (Addendum)
His LDL is 153. He's on low-dose Pravachol. We discussed changing him to atorvastatin. Will check his lipids today. He has been trying to get out and exercise a bit more.  I will see him in 1 year

## 2013-07-22 NOTE — Progress Notes (Signed)
  [facility] [address], [address], [place]  [address] Phone: [phone] Fax:  [phone]  Date:  07/22/2013   ID:  Tyrone Jefferson, DOB 07/14/1955, MRN [id number]  PCP:  [name], MD  Primary Cardiologist:  Dr. Liam Rogers   Problem List: 1. HTN 2. Hyperlipidemia 3. Obstructive sleep apnea -    History of Present Illness: Tyrone Jefferson is a 59 y.o. male who returns for followup.  He has a history of HTN, HL, OSA.  Last seen by me  1/13.  The patient denies chest pain, shortness of breath, syncope, orthopnea, PND or significant pedal edema.    July 22, 2013:  [name] is here for follow up.  Saw Richardson Dopp last year.   Works for [employer].    BP is ok. No CP, no dyspnea.   Has started walking more.       Wt Readings from Last 3 Encounters:  07/22/13 204 lb (92.534 kg)  08/25/12 200 lb (90.719 kg)  08/23/12 200 lb (90.719 kg)     Past Medical History  Diagnosis Date  . Hypertension   . Chest pain   . Dyspnea   . Hyperlipidemia   . OSA (obstructive sleep apnea)     Current Outpatient Prescriptions  Medication Sig Dispense Refill  . hydrochlorothiazide (HYDRODIURIL) 25 MG tablet Take 1 tablet (25 mg total) by mouth daily.  31 tablet  0  . lisinopril (PRINIVIL,ZESTRIL) 20 MG tablet Take 1 tablet (20 mg total) by mouth daily.  31 tablet  0  . pravastatin (PRAVACHOL) 20 MG tablet 1 tablet MWF       No current facility-administered medications for this visit.    Allergies:   No Known Allergies  Social History:  The patient  reports that he has never smoked. He does not have any smokeless tobacco history on file. He reports that he does not drink alcohol or use illicit drugs.   Family History:  The patient's family history includes Brain cancer in his mother; Colon cancer in his father; Diabetes in his mother; Heart attack (age of onset: 36) in his mother; Heart disease (age of onset: 34) in his father; Hypertension in his mother.   ROS:  Please  see the history of present illness.   He has some sinus congestion today.   All other systems reviewed and negative.   PHYSICAL EXAM: VS:  BP 128/86  Pulse 57  Ht 5\' 9"  (1.753 m)  Wt 204 lb (92.534 kg)  BMI 30.11 kg/m2 Well nourished, well developed, in no acute distress HEENT: normal Neck: no JVD Cardiac:  normal S1, S2; RRR; no murmur Lungs:  clear to auscultation bilaterally, no wheezing, rhonchi or rales Abd: soft, nontender, no hepatomegaly Ext: no edema Skin: warm and dry Neuro:  CNs 2-12 intact, no focal abnormalities noted  EKG: July 22, 2013:  Sinus brady at 76.  Otherwise , normal eCG

## 2013-07-22 NOTE — Assessment & Plan Note (Signed)
His blood pressures well controlled. Continue same medications. 

## 2013-07-22 NOTE — Patient Instructions (Addendum)
ENT doctors  Izetta Dakin  [facility] [place] Phone:  [phone]  Your physician recommends that you have lab work:  TODAY  Your physician recommends that you continue on your current medications as directed. Please refer to the Current Medication list given to you today.  Your physician wants you to follow-up in: 1 year with Dr. Acie Fredrickson.  You will receive a reminder letter in the mail two months in advance. If you don't receive a letter, please call our office to schedule the follow-up appointment.

## 2013-07-23 ENCOUNTER — Other Ambulatory Visit: Payer: Self-pay | Admitting: Cardiovascular Disease

## 2013-07-24 ENCOUNTER — Telehealth: Payer: Self-pay | Admitting: Cardiovascular Disease

## 2013-07-24 DIAGNOSIS — E785 Hyperlipidemia, unspecified: Secondary | ICD-10-CM

## 2013-07-24 DIAGNOSIS — I10 Essential (primary) hypertension: Secondary | ICD-10-CM

## 2013-07-24 MED ORDER — LISINOPRIL 20 MG PO TABS: 20.0000 mg | ORAL_TABLET | Freq: Every day | ORAL | Status: DC

## 2013-07-24 MED ORDER — HYDROCHLOROTHIAZIDE 25 MG PO TABS: 25.0000 mg | ORAL_TABLET | Freq: Every day | ORAL | Status: DC

## 2013-07-24 MED ORDER — PRAVASTATIN SODIUM 20 MG PO TABS: ORAL_TABLET | ORAL | Status: DC

## 2013-07-24 NOTE — Telephone Encounter (Signed)
Patient returning call for lab results. Provided results to patient and discussed them in detail. Patient given Dr. Elmarie Shiley advisement to switch to Atorvastatin and discontinue Pravachol. Patient states he would prefer to remain on Pravachol, as he did not really remain compliant on it previously and he did not inform Dr. Acie Fredrickson of that information. He states that he had not taken it for several months and just wanted to see what would happen to his lab results if he did not take it. He states that he prefers not to go on Atorvastatin and he will restart the Pravachol and be compliant on it. Patient states that all his medications need refilling now that he has kept his appointment with Dr. Acie Fredrickson on 07/22/13. Medications re-ordered. Note sent to Dr. Acie Fredrickson regarding patient's preference not to take the Atorvastatin but to remain on Pravachol.

## 2013-07-24 NOTE — Telephone Encounter (Signed)
Previous note noted. Will cont pravachol and repeat fasting labs in 3 months.

## 2013-07-24 NOTE — Progress Notes (Signed)
Quick Note:  Patient returning call for lab results. Provided results to patient and discussed them in detail. Patient given Dr. Elmarie Shiley advisement to switch to Atorvastatin and discontinue Pravachol. Patient states he would prefer to remain on Pravachol, as he did not really remain compliant on it previously and he did not inform Dr. Acie Fredrickson of that information. He states that he had not taken it for several months and just wanted to see what would happen to his lab results if he did not take it. He states that he prefers not to go on Atorvastatin and he will restart the Pravachol and be compliant on it. Patient states that all his medications need refilling now that he has kept his appointment with Dr. Acie Fredrickson on 07/22/13. Medications re-ordered. Note sent to Dr. Acie Fredrickson regarding patient's preference not to take the Atorvastatin but to remain on Pravachol. ______

## 2013-07-24 NOTE — Telephone Encounter (Signed)
New message          Pt returning Michelle's call about lab results

## 2013-07-25 ENCOUNTER — Telehealth: Payer: Self-pay | Admitting: Family Medicine

## 2013-07-25 NOTE — Telephone Encounter (Signed)
Pt has seen a little blood in his stool a couple of times. Pt spoke w./ triage who advised pt he may want to do a hemmocult card. Pt would like to pu one and complete. Pt has sch a cpe for 6/18. Labs have been done at dr nahser office. pls advise if ok for pt to do hemmocult.

## 2013-07-25 NOTE — Telephone Encounter (Signed)
Called patient and advised him that Dr. Acie Fredrickson would like him to get repeat lab work in 3 months.  Patient scheduled for repeat fasting lipid/liver/bmet on 9/10; patient verbalized understanding and agreement

## 2013-07-25 NOTE — Telephone Encounter (Signed)
Mailed Hemmocult cards to patient home address pt is aware.

## 2013-07-25 NOTE — Telephone Encounter (Signed)
OK 

## 2013-08-07 ENCOUNTER — Ambulatory Visit (INDEPENDENT_AMBULATORY_CARE_PROVIDER_SITE_OTHER): Payer: BC Managed Care – PPO | Admitting: Family Medicine

## 2013-08-07 ENCOUNTER — Encounter: Payer: Self-pay | Admitting: Family Medicine

## 2013-08-07 VITALS — BP 120/74 | Temp 98.7°F | Ht 68.25 in | Wt 203.0 lb

## 2013-08-07 DIAGNOSIS — Z0000 Encounter for general adult medical examination without abnormal findings: Secondary | ICD-10-CM

## 2013-08-07 DIAGNOSIS — E669 Obesity, unspecified: Secondary | ICD-10-CM | POA: Insufficient documentation

## 2013-08-07 LAB — CBC WITH DIFFERENTIAL/PLATELET
Basophils Absolute: 0 10*3/uL (ref 0.0–0.1)
Basophils Relative: 0.4 % (ref 0.0–3.0)
EOS ABS: 0.1 10*3/uL (ref 0.0–0.7)
Eosinophils Relative: 1.7 % (ref 0.0–5.0)
HCT: 45.8 % (ref 39.0–52.0)
Hemoglobin: 15.9 g/dL (ref 13.0–17.0)
LYMPHS PCT: 30.8 % (ref 12.0–46.0)
Lymphs Abs: 2.3 10*3/uL (ref 0.7–4.0)
MCHC: 34.7 g/dL (ref 30.0–36.0)
MCV: 87.6 fl (ref 78.0–100.0)
Monocytes Absolute: 0.8 10*3/uL (ref 0.1–1.0)
Monocytes Relative: 10.8 % (ref 3.0–12.0)
NEUTROS ABS: 4.2 10*3/uL (ref 1.4–7.7)
NEUTROS PCT: 56.3 % (ref 43.0–77.0)
Platelets: 267 10*3/uL (ref 150.0–400.0)
RBC: 5.23 Mil/uL (ref 4.22–5.81)
RDW: 13.2 % (ref 11.5–15.5)
WBC: 7.5 10*3/uL (ref 4.0–10.5)

## 2013-08-07 LAB — PSA: PSA: 1.54 ng/mL (ref 0.10–4.00)

## 2013-08-07 LAB — TSH: TSH: 1.76 u[IU]/mL (ref 0.35–4.50)

## 2013-08-07 NOTE — Patient Instructions (Signed)
Confirm whether your father had colon cancer.  If he did, consider set up repeat colonoscopy this year.

## 2013-08-07 NOTE — Progress Notes (Signed)
Pre visit review using our clinic review tool, if applicable. No additional management support is needed unless otherwise documented below in the visit note. 

## 2013-08-07 NOTE — Progress Notes (Signed)
Subjective:    Patient ID: Tyrone Jefferson, male    DOB: 04/08/1955, 58 y.o.   MRN: [id number]  HPI  Complete physical. He has history of hyperlipidemia and hypertension. Blood pressures have been well controlled. He has been inconsistent with taking pravastatin. This is prescribed per cardiology. He does not have any cardiac history. Nonsmoker. 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 Colonoscopy 6 years ago. Some question of whether father had colon cancer but not definitive. He has history of mild obstructive sleep apnea and is considering dental appliance.  Past Medical History  Diagnosis Date  . Hypertension   . Chest pain   . Dyspnea   . Hyperlipidemia   . OSA (obstructive sleep apnea)    Past Surgical History  Procedure Laterality Date  . Back surgery  1992  . Spine surgery  1992    L4-5 discectomy    reports that he has never smoked. He does not have any smokeless tobacco history on file. He reports that he does not drink alcohol or use illicit drugs. family history includes Brain cancer in his mother; Colon cancer in his father; Diabetes in his mother; Heart attack (age of onset: 7) in his mother; Heart disease (age of onset: 73) in his father; Hypertension in his mother. No Known Allergies    Review of Systems  Constitutional: Negative for fever, activity change, appetite change and fatigue.  HENT: Negative for congestion, ear pain and trouble swallowing.   Eyes: Negative for pain and visual disturbance.  Respiratory: Negative for cough, shortness of breath and wheezing.   Cardiovascular: Negative for chest pain and palpitations.  Gastrointestinal: Negative for nausea, vomiting, abdominal pain, diarrhea, constipation, blood in stool, abdominal distention and rectal pain.  Genitourinary: Negative for dysuria, hematuria and testicular pain.  Musculoskeletal:  Negative for arthralgias and joint swelling.  Skin: Negative for rash.  Neurological: Negative for dizziness, syncope and headaches.  Hematological: Negative for adenopathy.  Psychiatric/Behavioral: Negative for confusion and dysphoric mood.       Objective:   Physical Exam  Constitutional: He is oriented to person, place, and time. He appears well-developed and well-nourished. No distress.  HENT:  Head: Normocephalic and atraumatic.  Right Ear: External ear normal.  Left Ear: External ear normal.  Mouth/Throat: Oropharynx is clear and moist.  Eyes: Conjunctivae and EOM are normal. Pupils are equal, round, and reactive to light.  Neck: Normal range of motion. Neck supple. No thyromegaly present.  Cardiovascular: Normal rate, regular rhythm and normal heart sounds.   No murmur heard. Pulmonary/Chest: No respiratory distress. He has no wheezes. He has no rales.  Abdominal: Soft. Bowel sounds are normal. He exhibits no distension and no mass. There is no tenderness. There is no rebound and no guarding.  Genitourinary: Rectum normal and prostate normal.  Musculoskeletal: He exhibits no edema.  Lymphadenopathy:    He has no cervical adenopathy.  Neurological: He is alert and oriented to person, place, and time. He displays normal reflexes. No cranial nerve deficit.  Skin: No rash noted.  Psychiatric: He has a normal mood and affect.          Assessment & Plan:  #1 complete physical. Confirm whether father had colon cancer. If so, consider setting up repeat colonoscopy now. He had recent lipids and chemistries through cardiology and these are not repeated. Check PSA along with CBC and TSH.  Establish more consistent exercise

## 2013-10-29 ENCOUNTER — Other Ambulatory Visit: Payer: BC Managed Care – PPO

## 2013-10-30 ENCOUNTER — Other Ambulatory Visit: Payer: BC Managed Care – PPO

## 2014-01-16 ENCOUNTER — Other Ambulatory Visit: Payer: Self-pay | Admitting: Cardiovascular Disease

## 2014-02-16 ENCOUNTER — Other Ambulatory Visit: Payer: Self-pay | Admitting: Cardiovascular Disease

## 2014-03-21 ENCOUNTER — Other Ambulatory Visit: Payer: Self-pay | Admitting: Cardiovascular Disease

## 2014-04-03 ENCOUNTER — Telehealth: Payer: Self-pay | Admitting: Family Medicine

## 2014-04-03 MED ORDER — LISINOPRIL 20 MG PO TABS: 20.0000 mg | ORAL_TABLET | Freq: Every day | ORAL | Status: DC

## 2014-04-03 MED ORDER — HYDROCHLOROTHIAZIDE 25 MG PO TABS: 25.0000 mg | ORAL_TABLET | Freq: Every day | ORAL | Status: DC

## 2014-04-03 NOTE — Telephone Encounter (Signed)
RX sent to pharmacy  

## 2014-04-03 NOTE — Telephone Encounter (Signed)
Pt needs refills on hctz 25 mg,and lisinopril call into [facility]. Pt is not due for cpx until 07-2014. Pt will callback to sch

## 2014-04-09 ENCOUNTER — Encounter: Payer: Self-pay | Admitting: Family Medicine

## 2014-04-09 ENCOUNTER — Ambulatory Visit (INDEPENDENT_AMBULATORY_CARE_PROVIDER_SITE_OTHER): Payer: BLUE CROSS/BLUE SHIELD | Admitting: Family Medicine

## 2014-04-09 VITALS — BP 130/80 | HR 70 | Temp 98.0°F | Wt 207.0 lb

## 2014-04-09 DIAGNOSIS — J209 Acute bronchitis, unspecified: Secondary | ICD-10-CM

## 2014-04-09 NOTE — Patient Instructions (Signed)

## 2014-04-09 NOTE — Progress Notes (Signed)
   Subjective:    Patient ID: Tyrone Jefferson, male    DOB: [birth year], 58 y.o.   MRN: [id number]  HPI Patient is nonsmoker seen with about 3 week history of mostly nonproductive cough. He states he had typical cold-like symptoms a few weeks ago but those have resolved. He is taken over-the-counter cough medications with help. He does not particular feel sick. No wheezing. No GERD symptoms. Denies postnasal drip symptoms. Never smoked. Denies any hemoptysis or pleuritic pain.  Past Medical History  Diagnosis Date  . Hypertension   . Chest pain   . Dyspnea   . Hyperlipidemia   . OSA (obstructive sleep apnea)    Past Surgical History  Procedure Laterality Date  . Back surgery  1992  . Spine surgery  1992    L4-5 discectomy    reports that he has never smoked. He does not have any smokeless tobacco history on file. He reports that he does not drink alcohol or use illicit drugs. family history includes Brain cancer in his mother; Colon cancer in his father; Diabetes in his mother; Heart attack (age of onset: 23) in his mother; Heart disease (age of onset: 59) in his father; Hypertension in his mother. No Known Allergies    Review of Systems  Constitutional: Negative for fever, chills, appetite change and unexpected weight change.  HENT: Negative for congestion and postnasal drip.   Respiratory: Positive for cough. Negative for shortness of breath and wheezing.        Objective:   Physical Exam  Constitutional: He appears well-developed and well-nourished.  HENT:  Right Ear: External ear normal.  Left Ear: External ear normal.  Mouth/Throat: Oropharynx is clear and moist.  Neck: Neck supple.  Cardiovascular: Normal rate and regular rhythm.   Pulmonary/Chest: Effort normal and breath sounds normal. No respiratory distress. He has no wheezes. He has no rales.  Lymphadenopathy:    He has no cervical adenopathy.          Assessment & Plan:  Cough. Suspect resolving viral  bronchitis. Nonfocal exam. We have recommended observation. He does take ACE inhibitor but has been on this for several years. If cough persist into the next 2 weeks consider changing his ACE inhibitor to angiotensin receptor blocker. Follow-up for any changes symptoms

## 2014-04-09 NOTE — Progress Notes (Signed)
Pre visit review using our clinic review tool, if applicable. No additional management support is needed unless otherwise documented below in the visit note. 

## 2014-08-07 ENCOUNTER — Other Ambulatory Visit (INDEPENDENT_AMBULATORY_CARE_PROVIDER_SITE_OTHER): Payer: BLUE CROSS/BLUE SHIELD

## 2014-08-07 DIAGNOSIS — Z0000 Encounter for general adult medical examination without abnormal findings: Secondary | ICD-10-CM

## 2014-08-07 LAB — COMPREHENSIVE METABOLIC PANEL
ALT: 19 U/L (ref 0–53)
AST: 19 U/L (ref 0–37)
Albumin: 4.2 g/dL (ref 3.5–5.2)
Alkaline Phosphatase: 59 U/L (ref 39–117)
BILIRUBIN TOTAL: 0.5 mg/dL (ref 0.2–1.2)
BUN: 16 mg/dL (ref 6–23)
CO2: 32 mEq/L (ref 19–32)
Calcium: 9 mg/dL (ref 8.4–10.5)
Chloride: 100 mEq/L (ref 96–112)
Creatinine, Ser: 0.99 mg/dL (ref 0.40–1.50)
GFR: 82.42 mL/min (ref >60.00)
GLUCOSE: 105 mg/dL — AB (ref 70–99)
Potassium: 3.9 mEq/L (ref 3.5–5.1)
Sodium: 136 mEq/L (ref 135–145)
Total Protein: 6.3 g/dL (ref 6.0–8.3)

## 2014-08-07 LAB — CBC WITH DIFFERENTIAL/PLATELET
BASOS PCT: 0.4 % (ref 0.0–3.0)
Basophils Absolute: 0 10*3/uL (ref 0.0–0.1)
Eosinophils Absolute: 0.1 10*3/uL (ref 0.0–0.7)
Eosinophils Relative: 1.4 % (ref 0.0–5.0)
HCT: 46.9 % (ref 39.0–52.0)
Hemoglobin: 16.1 g/dL (ref 13.0–17.0)
LYMPHS ABS: 2 10*3/uL (ref 0.7–4.0)
LYMPHS PCT: 29.3 % (ref 12.0–46.0)
MCHC: 34.3 g/dL (ref 30.0–36.0)
MCV: 88 fl (ref 78.0–100.0)
MONO ABS: 0.6 10*3/uL (ref 0.1–1.0)
MONOS PCT: 9.2 % (ref 3.0–12.0)
NEUTROS ABS: 4 10*3/uL (ref 1.4–7.7)
Neutrophils Relative %: 59.7 % (ref 43.0–77.0)
PLATELETS: 242 10*3/uL (ref 150.0–400.0)
RBC: 5.33 Mil/uL (ref 4.22–5.81)
RDW: 13 % (ref 11.5–15.5)
WBC: 6.7 10*3/uL (ref 4.0–10.5)

## 2014-08-07 LAB — LIPID PANEL
CHOL/HDL RATIO: 5
Cholesterol: 187 mg/dL (ref 0–200)
HDL: 36.7 mg/dL — ABNORMAL LOW (ref >39.00)
LDL Cholesterol: 116 mg/dL — ABNORMAL HIGH (ref 0–99)
NONHDL: 150.3
Triglycerides: 173 mg/dL — ABNORMAL HIGH (ref 0.0–149.0)
VLDL: 34.6 mg/dL (ref 0.0–40.0)

## 2014-08-07 LAB — TSH: TSH: 2.02 u[IU]/mL (ref 0.35–4.50)

## 2014-08-07 LAB — PSA: PSA: 1.33 ng/mL (ref 0.10–4.00)

## 2014-08-13 ENCOUNTER — Ambulatory Visit (INDEPENDENT_AMBULATORY_CARE_PROVIDER_SITE_OTHER): Payer: BLUE CROSS/BLUE SHIELD | Admitting: Family Medicine

## 2014-08-13 ENCOUNTER — Encounter: Payer: Self-pay | Admitting: Family Medicine

## 2014-08-13 VITALS — BP 130/80 | HR 76 | Temp 97.6°F | Ht 68.0 in | Wt 207.0 lb

## 2014-08-13 DIAGNOSIS — Z0000 Encounter for general adult medical examination without abnormal findings: Secondary | ICD-10-CM

## 2014-08-13 DIAGNOSIS — E8881 Metabolic syndrome: Secondary | ICD-10-CM | POA: Diagnosis not present

## 2014-08-13 NOTE — Progress Notes (Signed)
Pre visit review using our clinic review tool, if applicable. No additional management support is needed unless otherwise documented below in the visit note. 

## 2014-08-13 NOTE — Patient Instructions (Signed)
Food Choices to Lower Your Triglycerides  Triglycerides are a type of fat in your blood. High levels of triglycerides can increase the risk of heart disease and stroke. If your triglyceride levels are high, the foods you eat and your eating habits are very important. Choosing the right foods can help lower your triglycerides.  WHAT GENERAL GUIDELINES DO I NEED TO FOLLOW?  Lose weight if you are overweight.   Limit or avoid alcohol.   Fill one half of your plate with vegetables and green salads.   Limit fruit to two servings a day. Choose fruit instead of juice.   Make one fourth of your plate whole grains. Look for the word "whole" as the first word in the ingredient list.  Fill one fourth of your plate with lean protein foods.  Enjoy fatty fish (such as salmon, mackerel, sardines, and tuna) three times a week.   Choose healthy fats.   Limit foods high in starch and sugar.  Eat more home-cooked food and less restaurant, buffet, and fast food.  Limit fried foods.  Cook foods using methods other than frying.  Limit saturated fats.  Check ingredient lists to avoid foods with partially hydrogenated oils (trans fats) in them. WHAT FOODS CAN I EAT?  Grains Whole grains, such as whole wheat or whole grain breads, crackers, cereals, and pasta. Unsweetened oatmeal, bulgur, barley, quinoa, or brown rice. Corn or whole wheat flour tortillas.  Vegetables Fresh or frozen vegetables (raw, steamed, roasted, or grilled). Green salads. Fruits All fresh, canned (in natural juice), or frozen fruits. Meat and Other Protein Products Ground beef (85% or leaner), grass-fed beef, or beef trimmed of fat. Skinless chicken or turkey. Ground chicken or turkey. Pork trimmed of fat. All fish and seafood. Eggs. Dried beans, peas, or lentils. Unsalted nuts or seeds. Unsalted canned or dry beans. Dairy Low-fat dairy products, such as skim or 1% milk, 2% or reduced-fat cheeses, low-fat ricotta or  cottage cheese, or plain low-fat yogurt. Fats and Oils Tub margarines without trans fats. Light or reduced-fat mayonnaise and salad dressings. Avocado. Safflower, olive, or canola oils. Natural peanut or almond butter. The items listed above may not be a complete list of recommended foods or beverages. Contact your dietitian for more options. WHAT FOODS ARE NOT RECOMMENDED?  Grains White bread. White pasta. White rice. Cornbread. Bagels, pastries, and croissants. Crackers that contain trans fat. Vegetables White potatoes. Corn. Creamed or fried vegetables. Vegetables in a cheese sauce. Fruits Dried fruits. Canned fruit in light or heavy syrup. Fruit juice. Meat and Other Protein Products Fatty cuts of meat. Ribs, chicken wings, bacon, sausage, bologna, salami, chitterlings, fatback, hot dogs, bratwurst, and packaged luncheon meats. Dairy Whole or 2% milk, cream, half-and-half, and cream cheese. Whole-fat or sweetened yogurt. Full-fat cheeses. Nondairy creamers and whipped toppings. Processed cheese, cheese spreads, or cheese curds. Sweets and Desserts Corn syrup, sugars, honey, and molasses. Candy. Jam and jelly. Syrup. Sweetened cereals. Cookies, pies, cakes, donuts, muffins, and ice cream. Fats and Oils Butter, stick margarine, lard, shortening, ghee, or bacon fat. Coconut, palm kernel, or palm oils. Beverages Alcohol. Sweetened drinks (such as sodas, lemonade, and fruit drinks or punches). The items listed above may not be a complete list of foods and beverages to avoid. Contact your dietitian for more information. Document Released: 11/25/2003 Document Revised: 02/11/2013 Document Reviewed: 12/11/2012 [employer] Patient Information 2015 [employer]. This information is not intended to replace advice given to you by your health care provider. Make sure you discuss   any questions you have with your health care provider. Metabolic Syndrome, Adult Metabolic syndrome describes a group of  risk factors for heart disease and diabetes. This syndrome has other names including Insulin Resistance Syndrome. The more risk factors you have, the higher your risk of having a heart attack, stroke, or developing diabetes. These risk factors include:  High blood sugar.  High blood triglyceride (a fat found in the blood) level.  High blood pressure.  Abdominal obesity (your extra weight is around your waist instead of your hips).  Low levels of high-density lipoprotein, HDL (good blood cholesterol). If you have any three of these risk factors, you have metabolic syndrome. If you have even one of these factors, you should make lifestyle changes to improve your health in order to prevent serious health diseases.  In people with metabolic syndrome, the cells do not respond properly to insulin. This can lead to high levels of glucose in the blood, which can interfere with normal body processes. Eventually, this can cause high blood pressure and higher fat levels in the blood, and inflammation of your blood vessels. The result can be heart disease and stroke.  CAUSES   Eating a diet rich in calories and saturated fat.  Too little physical activity.  Being overweight. Other underlying causes are:  Family history (genetics).  Ethnicity (South Asians are at a higher risk).  Older age (your chances of developing metabolic syndrome are higher as you grow older).  Insulin resistance. SYMPTOMS  By itself, metabolic syndrome has no symptoms. However, you might have symptoms of diabetes (high blood sugar) or high blood pressure, such as:  Increased thirst, urination, and tiredness.  Dizzy spells.  Dull headaches that are unusual for you.  Blurred vision.  Nosebleeds. DIAGNOSIS  Your caregiver may make a diagnosis of metabolic syndrome if you have at least three of these factors:  If you are overweight mostly around the waist. This means a waistline greater than 40" in men and more than  35" in women. The waistline limits are 31 to 35 inches for women and 37 to 39 inches for men. In those who have certain genetic risk factors, such as having a family history of diabetes or being of Asian descent.  If you have a blood pressure of 130/85 mm Hg or more, or if you are being treated for high blood pressure.  If your blood triglyceride level is 150 mg/dL or more, or you are being treated for high levels of triglyceride.  If the level of HDL in your blood is below 40 mg/dL in men, less than 50 mg/dL in women, or you are receiving treatment for low levels of HDL.  If the level of sugar in your blood is high with fasting blood sugar level of 110 mg/dL or more, or you are under treatment for diabetes. TREATMENT  Your caregiver may have you make lifestyle changes, which may include:  Exercise.  Losing weight.  Maintaining a healthy diet.  Quitting smoking. The lifestyle changes listed above are key in reducing your risk for heart disease and stroke. Medicines may also be prescribed to help your body respond to insulin better and to reduce your blood pressure and blood fat levels. Aspirin may be recommended to reduce risks of heart disease or stroke.  HOME CARE INSTRUCTIONS   Exercise.  Measure your waist at regular intervals just above the hipbones after you have breathed out.  Maintain a healthy diet.  Eat fruits, such as apples, oranges, and  pears.  Eat vegetables.  Eat legumes, such as kidney beans, peas, and lentils.  Eat food rich in soluble fiber, such as whole grain cereal, oatmeal, and oat bran.  Use olive or safflower oils and avoid saturated fats.  Eat nuts.  Limit the amount of salt you eat or add to food.  Limit the amount of alcohol you drink.  Include fish in your diet, if possible.  Stop smoking if you are a smoker.  Maintain regular follow-up appointments.  Follow your caregiver's advice. SEEK MEDICAL CARE IF:   You feel very tired or  fatigued.  You develop excessive thirst.  You pass large quantities of urine.  You are putting on weight around your waist rather than losing weight.  You develop headaches over and over again.  You have off-and-on dizzy spells. SEEK IMMEDIATE MEDICAL CARE IF:   You develop nosebleeds.  You develop sudden blurred vision.  You develop sudden dizzy spells.  You develop chest pains, trouble breathing, or feel an abnormal or irregular heart beat.  You have a fainting episode.  You develop any sudden trouble speaking and/or swallowing.  You develop sudden weakness in one arm and/or one leg. MAKE SURE YOU:   Understand these instructions.  Will watch your condition.  Will get help right away if you are not doing well or get worse. Document Released: 05/16/2007 Document Revised: 06/23/2013 Document Reviewed: 05/16/2007 [facility] Patient Information 2015 [place], [place]. This information is not intended to replace advice given to you by your health care provider. Make sure you discuss any questions you have with your health care provider.

## 2014-08-13 NOTE — Progress Notes (Signed)
   Subjective:    Patient ID: Tyrone Jefferson, male    DOB: 05-26-1955, 59 y.o.   MRN: [id number]  HPI Patient here for complete physical. He has history of dyslipidemia and hypertension. He is having some muscle aches with pravastatin and discontinued this several months ago. Has had intolerance with other statins previously. He remains on lisinopril and HCTZ. No consistent exercise. Poor dietary compliance at times. Tetanus up-to-date. Colonoscopy up-to-date. Last year he had some question regarding what type of cancerous father had. He thinks that as well as renal cell carcinoma.  Past Medical History  Diagnosis Date  . Hypertension   . Chest pain   . Dyspnea   . Hyperlipidemia   . OSA (obstructive sleep apnea)    Past Surgical History  Procedure Laterality Date  . Back surgery  1992  . Spine surgery  1992    L4-5 discectomy    reports that he has never smoked. He does not have any smokeless tobacco history on file. He reports that he does not drink alcohol or use illicit drugs. family history includes Brain cancer in his mother; Colon cancer in his father; Diabetes in his mother; Heart attack (age of onset: 28) in his mother; Heart disease (age of onset: 2) in his father; Hypertension in his mother. No Known Allergies    Review of Systems  Constitutional: Negative for fever, activity change, appetite change, fatigue and unexpected weight change.  HENT: Negative for congestion, ear pain and trouble swallowing.   Eyes: Negative for pain and visual disturbance.  Respiratory: Negative for cough, shortness of breath and wheezing.   Cardiovascular: Negative for chest pain and palpitations.  Gastrointestinal: Negative for nausea, vomiting, abdominal pain, diarrhea, constipation, blood in stool, abdominal distention and rectal pain.  Genitourinary: Negative for dysuria, hematuria and testicular pain.  Musculoskeletal: Negative for joint swelling and arthralgias.  Skin: Negative for  rash.  Neurological: Negative for dizziness, syncope and headaches.  Hematological: Negative for adenopathy.  Psychiatric/Behavioral: Negative for confusion and dysphoric mood.       Objective:   Physical Exam  Constitutional: He is oriented to person, place, and time. He appears well-developed and well-nourished. No distress.  HENT:  Head: Normocephalic and atraumatic.  Right Ear: External ear normal.  Left Ear: External ear normal.  Mouth/Throat: Oropharynx is clear and moist.  Eyes: Conjunctivae and EOM are normal. Pupils are equal, round, and reactive to light.  Neck: Normal range of motion. Neck supple. No thyromegaly present.  Cardiovascular: Normal rate, regular rhythm and normal heart sounds.   No murmur heard. Pulmonary/Chest: No respiratory distress. He has no wheezes. He has no rales.  Abdominal: Soft. Bowel sounds are normal. He exhibits no distension and no mass. There is no tenderness. There is no rebound and no guarding.  Musculoskeletal: He exhibits no edema.  Lymphadenopathy:    He has no cervical adenopathy.  Neurological: He is alert and oriented to person, place, and time. He displays normal reflexes. No cranial nerve deficit.  Skin: No rash noted.  Psychiatric: He has a normal mood and affect.          Assessment & Plan:  Complete physical. Labs reviewed. He has metabolic syndrome based on slightly elevated glucose 105, high triglycerides, low HDL, and hypertension. He is strongly advised to lose weight. Establish more consistent exercise. Reduce sugars and starches. Continue current blood pressure medications. Handout on hypertriglyceridemia given

## 2014-12-14 ENCOUNTER — Other Ambulatory Visit: Payer: Self-pay | Admitting: Family Medicine

## 2015-01-11 ENCOUNTER — Other Ambulatory Visit: Payer: Self-pay | Admitting: Family Medicine

## 2015-01-12 ENCOUNTER — Ambulatory Visit (INDEPENDENT_AMBULATORY_CARE_PROVIDER_SITE_OTHER): Payer: BLUE CROSS/BLUE SHIELD | Admitting: Family Medicine

## 2015-01-12 ENCOUNTER — Encounter: Payer: Self-pay | Admitting: Family Medicine

## 2015-01-12 VITALS — BP 158/88 | Temp 98.8°F | Wt 207.0 lb

## 2015-01-12 DIAGNOSIS — M7582 Other shoulder lesions, left shoulder: Secondary | ICD-10-CM

## 2015-01-12 DIAGNOSIS — M25612 Stiffness of left shoulder, not elsewhere classified: Secondary | ICD-10-CM

## 2015-01-12 DIAGNOSIS — M25512 Pain in left shoulder: Secondary | ICD-10-CM | POA: Diagnosis not present

## 2015-01-12 NOTE — Patient Instructions (Signed)
We will call you within a week about your referral to Dr. Teresa Coombs of [facility]. I asked for them to try to get you in by next Friday.

## 2015-01-12 NOTE — Progress Notes (Signed)
Garret Reddish, MD  Subjective:  Tyrone Jefferson is a 59 y.o. year old very pleasant male patient who presents for/with See problem oriented charting ROS- No chest pain or shortness of breath. No headache or blurry vision.   Past Medical History-  Patient Active Problem List   Diagnosis Date Noted  . Metabolic syndrome [id number]  . Obesity (BMI 30-39.9) 08/07/2013  . Hyperlipidemia 05/29/2012  . OSA (obstructive sleep apnea) 06/29/2010  . Hypertension   . Chest pain   . Dyspnea    Medications- reviewed and updated Current Outpatient Prescriptions  Medication Sig Dispense Refill  . hydrochlorothiazide (HYDRODIURIL) 25 MG tablet TAKE 1 TABLET (25 MG TOTAL) BY MOUTH DAILY. 90 tablet 1  . lisinopril (PRINIVIL,ZESTRIL) 20 MG tablet TAKE 1 TABLET (20 MG TOTAL) BY MOUTH DAILY. 90 tablet 2     Objective: BP 158/88 mmHg  Temp(Src) 98.8 F (37.1 C)  Wt 207 lb (93.895 kg) Gen: NAD, resting comfortably CV: RRR no murmurs rubs or gallops Lungs: CTAB no crackles, wheeze, rhonchi Ext: no edema No asymetry when comparing shoulders except with ROM Skin: warm, dry- no rash over shoulders Neuro: grossly normal, moves all extremities, intact distal sensation and good grip strength  Assessment/Plan:  Left shoulder Pain  Left shoulder- limited range of motion S:2 months ago dropped something on the floor and then picked it up in car and threw it out the window. Left shoulder popped, then started to get worse but never fully went away. Still does not have all of his flexibility and movement. Pain if sleeps on left side at night. Occasionally will ache with just sitting- dull ache. Not full rane putting shoulder behind head, cannot fully get hand behind his back-difficult time putting belt on.   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 Was pushing cart and hit an object and excruciating pain in left shoulder about 2 weeks ago but has not had worsening of symptoms since that time.   O: Left Shoulder: Inspection reveals no abnormalities, atrophy or asymmetry. No obvious dislocation.  Palpation is normal with no tenderness over AC joint or bicipital groove. ROM is limited- cannot fully rotate elbow out with arm behind head. Cannot reach above his belt with hand when putting arm behind him whereas right hand can reach mid back. Nearly full ROM with abduction- limited by about 5 degrees difference. Neer is limited at past 135 degrees. Pain with each of these motions Rotator cuff strength normal throughout. No signs of impingement with negative Hawkin's tests, empty can. Cannot complete Neer.  Does have mildly painful arc.   A/P: 59 year old with left shoulder pain and limited range of motion. Not obvious rotator cuff issue. May be frozen shoulder though abduction is pretty reasonable. Will refer to orthopedics- Dr. Teresa Coombs within [facility].   BP Readings from Last 3 Encounters:  01/12/15 158/88  08/13/14 130/80  04/09/14 130/80  patient is in some pain today and previously controlled- will monitor. Is compliant with meds.   Orders Placed This Encounter  Procedures  . Ambulatory referral to Orthopedic Surgery    Referral Priority:  Routine    Referral Type:  Surgical    Referral Reason:  Specialty Services Required    Requested Specialty:  Orthopedic Surgery    Number of Visits Requested:  1

## 2015-06-10 ENCOUNTER — Other Ambulatory Visit: Payer: Self-pay | Admitting: Family Medicine

## 2015-06-16 ENCOUNTER — Ambulatory Visit (INDEPENDENT_AMBULATORY_CARE_PROVIDER_SITE_OTHER): Payer: BLUE CROSS/BLUE SHIELD | Admitting: Family Medicine

## 2015-06-16 ENCOUNTER — Encounter: Payer: Self-pay | Admitting: Family Medicine

## 2015-06-16 VITALS — BP 138/98 | HR 84 | Temp 98.3°F | Ht 68.0 in | Wt 204.0 lb

## 2015-06-16 DIAGNOSIS — I10 Essential (primary) hypertension: Secondary | ICD-10-CM | POA: Diagnosis not present

## 2015-06-16 NOTE — Patient Instructions (Signed)
DASH Eating Plan DASH stands for "Dietary Approaches to Stop Hypertension." The DASH eating plan is a healthy eating plan that has been shown to reduce high blood pressure (hypertension). Additional health benefits may include reducing the risk of type 2 diabetes mellitus, heart disease, and stroke. The DASH eating plan may also help with weight loss. WHAT DO I NEED TO KNOW ABOUT THE DASH EATING PLAN? For the DASH eating plan, you will follow these general guidelines:  Choose foods with a percent daily value for sodium of less than 5% (as listed on the food label).  Use salt-free seasonings or herbs instead of table salt or sea salt.  Check with your health care provider or pharmacist before using salt substitutes.  Eat lower-sodium products, often labeled as "lower sodium" or "no salt added."  Eat fresh foods.  Eat more vegetables, fruits, and low-fat dairy products.  Choose whole grains. Look for the word "whole" as the first word in the ingredient list.  Choose fish and skinless chicken or turkey more often than red meat. Limit fish, poultry, and meat to 6 oz (170 g) each day.  Limit sweets, desserts, sugars, and sugary drinks.  Choose heart-healthy fats.  Limit cheese to 1 oz (28 g) per day.  Eat more home-cooked food and less restaurant, buffet, and fast food.  Limit fried foods.  Cook foods using methods other than frying.  Limit canned vegetables. If you do use them, rinse them well to decrease the sodium.  When eating at a restaurant, ask that your food be prepared with less salt, or no salt if possible. WHAT FOODS CAN I EAT? Seek help from a dietitian for individual calorie needs. Grains Whole grain or whole wheat bread. Brown rice. Whole grain or whole wheat pasta. Quinoa, bulgur, and whole grain cereals. Low-sodium cereals. Corn or whole wheat flour tortillas. Whole grain cornbread. Whole grain crackers. Low-sodium crackers. Vegetables Fresh or frozen vegetables  (raw, steamed, roasted, or grilled). Low-sodium or reduced-sodium tomato and vegetable juices. Low-sodium or reduced-sodium tomato sauce and paste. Low-sodium or reduced-sodium canned vegetables.  Fruits All fresh, canned (in natural juice), or frozen fruits. Meat and Other Protein Products Ground beef (85% or leaner), grass-fed beef, or beef trimmed of fat. Skinless chicken or turkey. Ground chicken or turkey. Pork trimmed of fat. All fish and seafood. Eggs. Dried beans, peas, or lentils. Unsalted nuts and seeds. Unsalted canned beans. Dairy Low-fat dairy products, such as skim or 1% milk, 2% or reduced-fat cheeses, low-fat ricotta or cottage cheese, or plain low-fat yogurt. Low-sodium or reduced-sodium cheeses. Fats and Oils Tub margarines without trans fats. Light or reduced-fat mayonnaise and salad dressings (reduced sodium). Avocado. Safflower, olive, or canola oils. Natural peanut or almond butter. Other Unsalted popcorn and pretzels. The items listed above may not be a complete list of recommended foods or beverages. Contact your dietitian for more options. WHAT FOODS ARE NOT RECOMMENDED? Grains White bread. White pasta. White rice. Refined cornbread. Bagels and croissants. Crackers that contain trans fat. Vegetables Creamed or fried vegetables. Vegetables in a cheese sauce. Regular canned vegetables. Regular canned tomato sauce and paste. Regular tomato and vegetable juices. Fruits Dried fruits. Canned fruit in light or heavy syrup. Fruit juice. Meat and Other Protein Products Fatty cuts of meat. Ribs, chicken wings, bacon, sausage, bologna, salami, chitterlings, fatback, hot dogs, bratwurst, and packaged luncheon meats. Salted nuts and seeds. Canned beans with salt. Dairy Whole or 2% milk, cream, half-and-half, and cream cheese. Whole-fat or sweetened yogurt. Full-fat   cheeses or blue cheese. Nondairy creamers and whipped toppings. Processed cheese, cheese spreads, or cheese  curds. Condiments Onion and garlic salt, seasoned salt, table salt, and sea salt. Canned and packaged gravies. Worcestershire sauce. Tartar sauce. Barbecue sauce. Teriyaki sauce. Soy sauce, including reduced sodium. Steak sauce. Fish sauce. Oyster sauce. Cocktail sauce. Horseradish. Ketchup and mustard. Meat flavorings and tenderizers. Bouillon cubes. Hot sauce. Tabasco sauce. Marinades. Taco seasonings. Relishes. Fats and Oils Butter, stick margarine, lard, shortening, ghee, and bacon fat. Coconut, palm kernel, or palm oils. Regular salad dressings. Other Pickles and olives. Salted popcorn and pretzels. The items listed above may not be a complete list of foods and beverages to avoid. Contact your dietitian for more information. WHERE CAN I FIND MORE INFORMATION? National Heart, Lung, and Blood Institute: travelstabloid.com   This information is not intended to replace advice given to you by your health care provider. Make sure you discuss any questions you have with your health care provider.   Document Released: 01/26/2011 Document Revised: 02/27/2014 Document Reviewed: 12/11/2012 Elsevier Interactive Patient Education 2016 [employer].  Try to lose some weight Establish more consistent exercise.

## 2015-06-16 NOTE — Progress Notes (Signed)
   Subjective:    Patient ID: Tyrone Jefferson, male    DOB: 03-20-1955, 60 y.o.   MRN: [id number]  HPI  patient seen for follow-up hypertension.  Currently takes lisinopril 20 mg and HCTZ 12.5 mg daily.  Recently was at orthopedic office and had reading of 170/90.  Compliant with medications. No headaches. No dizziness.  Home blood pressure readings recently 150-155/90.  no alcohol use. No nonsteroidal use. No consistent exercise.  No recent chest pains.  Past Medical History  Diagnosis Date  . Hypertension   . Chest pain   . Dyspnea   . Hyperlipidemia   . OSA (obstructive sleep apnea)   . Metabolic syndrome    Past Surgical History  Procedure Laterality Date  . Back surgery  1992  . Spine surgery  1992    L4-5 discectomy    reports that he has never smoked. He does not have any smokeless tobacco history on file. He reports that he does not drink alcohol or use illicit drugs. family history includes Brain cancer in his mother; Colon cancer in his father; Diabetes in his mother; Heart attack (age of onset: 24) in his mother; Heart disease (age of onset: 6) in his father; Hypertension in his mother. No Known Allergies    Review of Systems  Constitutional: Negative for fatigue and unexpected weight change.  Eyes: Negative for visual disturbance.  Respiratory: Negative for cough, chest tightness and shortness of breath.   Cardiovascular: Negative for chest pain, palpitations and leg swelling.  Neurological: Negative for dizziness, syncope, weakness, light-headedness and headaches.       Objective:   Physical Exam  Constitutional: He is oriented to person, place, and time. He appears well-developed and well-nourished.  HENT:  Right Ear: External ear normal.  Left Ear: External ear normal.  Mouth/Throat: Oropharynx is clear and moist.  Eyes: Pupils are equal, round, and reactive to light.  Neck: Neck supple. No thyromegaly present.  Cardiovascular: Normal rate and  regular rhythm.   Pulmonary/Chest: Effort normal and breath sounds normal. No respiratory distress. He has no wheezes. He has no rales.  Musculoskeletal: He exhibits no edema.  Neurological: He is alert and oriented to person, place, and time.          Assessment & Plan:   Hypertension. Poorly controlled. We discussed options including additional medication versus lifestyle management. Work on weight loss.  Establish more consistent aerobic exercise. Information on DASH diet given. Monitor for 3 months and if not improved at that point consider additional medication  [name] MD [facility]

## 2015-06-16 NOTE — Progress Notes (Signed)
Pre visit review using our clinic review tool, if applicable. No additional management support is needed unless otherwise documented below in the visit note. 

## 2015-09-17 ENCOUNTER — Ambulatory Visit (INDEPENDENT_AMBULATORY_CARE_PROVIDER_SITE_OTHER): Payer: BLUE CROSS/BLUE SHIELD | Admitting: Family Medicine

## 2015-09-17 ENCOUNTER — Encounter: Payer: Self-pay | Admitting: Family Medicine

## 2015-09-17 VITALS — BP 140/80 | HR 84 | Temp 98.1°F | Ht 68.0 in | Wt 200.0 lb

## 2015-09-17 DIAGNOSIS — I10 Essential (primary) hypertension: Secondary | ICD-10-CM

## 2015-09-17 NOTE — Progress Notes (Signed)
Subjective:     Patient ID: Tyrone Jefferson, male   DOB: 02-16-1956, 60 y.o.   MRN: [id number]  HPI Patient seen for follow-up hypertension Currently takes lisinopril and HCTZ. He was seen here a few months ago with elevated blood pressure and we elected lifestyle management versus adding more medication. He dropped about 4 pounds. Exercises some, though inconsistently. No headaches or dizziness. Compliant with therapy.  Past Medical History:  Diagnosis Date  . Chest pain   . Dyspnea   . Hyperlipidemia   . Hypertension   . Metabolic syndrome   . OSA (obstructive sleep apnea)    Past Surgical History:  Procedure Laterality Date  . BACK SURGERY  1992  . SPINE SURGERY  1992   L4-5 discectomy    reports that he has never smoked. He does not have any smokeless tobacco history on file. He reports that he does not drink alcohol or use drugs. family history includes Brain cancer in his mother; Colon cancer in his father; Diabetes in his mother; Heart attack (age of onset: 82) in his mother; Heart disease (age of onset: 4) in his father; Hypertension in his mother. No Known Allergies   Review of Systems  Constitutional: Negative for fatigue.  Eyes: Negative for visual disturbance.  Respiratory: Negative for cough, chest tightness and shortness of breath.   Cardiovascular: Negative for chest pain, palpitations and leg swelling.  Neurological: Negative for dizziness, syncope, weakness, light-headedness and headaches.       Objective:   Physical Exam  Constitutional: He appears well-developed and well-nourished.  Neck: Neck supple. No thyromegaly present.  Cardiovascular: Normal rate and regular rhythm.   Pulmonary/Chest: Effort normal and breath sounds normal. No respiratory distress. He has no wheezes. He has no rales.  Musculoskeletal: He exhibits no edema.       Assessment:     Hypertension. Improved.    Plan:     -Establish more consistent exercise -Continue current  medications. -Be in touch should blood pressures consistently greater than 140/90  Tyrone Post MD [facility]

## 2015-09-17 NOTE — Patient Instructions (Signed)
Keep monitoring blood pressure and let me know if consistently > 140/90.

## 2015-09-17 NOTE — Progress Notes (Signed)
Pre visit review using our clinic review tool, if applicable. No additional management support is needed unless otherwise documented below in the visit note. 

## 2015-10-08 ENCOUNTER — Other Ambulatory Visit: Payer: Self-pay | Admitting: Family Medicine

## 2015-12-09 ENCOUNTER — Other Ambulatory Visit: Payer: Self-pay | Admitting: Family Medicine

## 2016-04-07 ENCOUNTER — Ambulatory Visit (INDEPENDENT_AMBULATORY_CARE_PROVIDER_SITE_OTHER): Payer: BLUE CROSS/BLUE SHIELD | Admitting: Family Medicine

## 2016-04-07 ENCOUNTER — Encounter: Payer: Self-pay | Admitting: Family Medicine

## 2016-04-07 VITALS — BP 160/100 | HR 83 | Ht 68.0 in | Wt 203.7 lb

## 2016-04-07 DIAGNOSIS — I10 Essential (primary) hypertension: Secondary | ICD-10-CM | POA: Diagnosis not present

## 2016-04-07 MED ORDER — AMLODIPINE BESYLATE 5 MG PO TABS: 5.0000 mg | ORAL_TABLET | Freq: Every day | ORAL | 11 refills | Status: DC

## 2016-04-07 NOTE — Progress Notes (Signed)
Subjective:     Patient ID: Tyrone Jefferson, male   DOB: 1955-09-02, 61 y.o.   MRN: [id number]  HPI Patient here to evaluate hypertension. He has known hypertension and currently takes HCTZ and lisinopril. Mostly compliant with therapy. Occasionally skips HCTZ. Recently has been to local pharmacy and has had multiple readings over [id number] systolic and around 90 diastolic. Occasional bifrontal headache. No alcohol use. No consistent nonsteroidal use. Does not exercise regularly. No chest pains. No peripheral edema.  Past Medical History:  Diagnosis Date  . Chest pain   . Dyspnea   . Hyperlipidemia   . Hypertension   . Metabolic syndrome   . OSA (obstructive sleep apnea)    Past Surgical History:  Procedure Laterality Date  . BACK SURGERY  1992  . SPINE SURGERY  1992   L4-5 discectomy    reports that he has never smoked. He has never used smokeless tobacco. He reports that he does not drink alcohol or use drugs. family history includes Brain cancer in his mother; Colon cancer in his father; Diabetes in his mother; Heart attack (age of onset: 9) in his mother; Heart disease (age of onset: 63) in his father; Hypertension in his mother. No Known Allergies   Review of Systems  Constitutional: Negative for fatigue.  Eyes: Negative for visual disturbance.  Respiratory: Negative for cough, chest tightness and shortness of breath.   Cardiovascular: Negative for chest pain, palpitations and leg swelling.  Neurological: Negative for dizziness, syncope, weakness, light-headedness and headaches.       Objective:   Physical Exam  Constitutional: He is oriented to person, place, and time. He appears well-developed and well-nourished.  HENT:  Right Ear: External ear normal.  Left Ear: External ear normal.  Mouth/Throat: Oropharynx is clear and moist.  Eyes: Pupils are equal, round, and reactive to light.  Neck: Neck supple. No thyromegaly present.  Cardiovascular: Normal rate and regular  rhythm.   Pulmonary/Chest: Effort normal and breath sounds normal. No respiratory distress. He has no wheezes. He has no rales.  Musculoskeletal: He exhibits no edema.  Neurological: He is alert and oriented to person, place, and time.       Assessment:     Hypertension-poorly controlled    Plan:     -Amlodipine 5 mg daily -Continue lisinopril and HCTZ -Establish more consistent exercise -Follow-up in one month reassess  [name] MD [facility] at [facility]

## 2016-04-07 NOTE — Patient Instructions (Signed)
Hypertension Hypertension, commonly called high blood pressure, is when the force of blood pumping through your arteries is too strong. Your arteries are the blood vessels that carry blood from your heart throughout your body. A blood pressure reading consists of a higher number over a lower number, such as 110/72. The higher number (systolic) is the pressure inside your arteries when your heart pumps. The lower number (diastolic) is the pressure inside your arteries when your heart relaxes. Ideally you want your blood pressure below 120/80. Hypertension forces your heart to work harder to pump blood. Your arteries may become narrow or stiff. Having untreated or uncontrolled hypertension can cause heart attack, stroke, kidney disease, and other problems. What increases the risk? Some risk factors for high blood pressure are controllable. Others are not. Risk factors you cannot control include:  Race. You may be at higher risk if you are African American.  Age. Risk increases with age.  Gender. Men are at higher risk than women before age 45 years. After age 65, women are at higher risk than men. Risk factors you can control include:  Not getting enough exercise or physical activity.  Being overweight.  Getting too much fat, sugar, calories, or salt in your diet.  Drinking too much alcohol. What are the signs or symptoms? Hypertension does not usually cause signs or symptoms. Extremely high blood pressure (hypertensive crisis) may cause headache, anxiety, shortness of breath, and nosebleed. How is this diagnosed? To check if you have hypertension, your health care provider will measure your blood pressure while you are seated, with your arm held at the level of your heart. It should be measured at least twice using the same arm. Certain conditions can cause a difference in blood pressure between your right and left arms. A blood pressure reading that is higher than normal on one occasion does  not mean that you need treatment. If it is not clear whether you have high blood pressure, you may be asked to return on a different day to have your blood pressure checked again. Or, you may be asked to monitor your blood pressure at home for 1 or more weeks. How is this treated? Treating high blood pressure includes making lifestyle changes and possibly taking medicine. Living a healthy lifestyle can help lower high blood pressure. You may need to change some of your habits. Lifestyle changes may include:  Following the DASH diet. This diet is high in fruits, vegetables, and whole grains. It is low in salt, red meat, and added sugars.  Keep your sodium intake below 2,300 mg per day.  Getting at least 30-45 minutes of aerobic exercise at least 4 times per week.  Losing weight if necessary.  Not smoking.  Limiting alcoholic beverages.  Learning ways to reduce stress. Your health care provider may prescribe medicine if lifestyle changes are not enough to get your blood pressure under control, and if one of the following is true:  You are 18-59 years of age and your systolic blood pressure is above 140.  You are 60 years of age or older, and your systolic blood pressure is above 150.  Your diastolic blood pressure is above 90.  You have diabetes, and your systolic blood pressure is over 140 or your diastolic blood pressure is over 90.  You have kidney disease and your blood pressure is above 140/90.  You have heart disease and your blood pressure is above 140/90. Your personal target blood pressure may vary depending on your medical   conditions, your age, and other factors. Follow these instructions at home:  Have your blood pressure rechecked as directed by your health care provider.  Take medicines only as directed by your health care provider. Follow the directions carefully. Blood pressure medicines must be taken as prescribed. The medicine does not work as well when you skip  doses. Skipping doses also puts you at risk for problems.  Do not smoke.  Monitor your blood pressure at home as directed by your health care provider. Contact a health care provider if:  You think you are having a reaction to medicines taken.  You have recurrent headaches or feel dizzy.  You have swelling in your ankles.  You have trouble with your vision. Get help right away if:  You develop a severe headache or confusion.  You have unusual weakness, numbness, or feel faint.  You have severe chest or abdominal pain.  You vomit repeatedly.  You have trouble breathing. This information is not intended to replace advice given to you by your health care provider. Make sure you discuss any questions you have with your health care provider. Document Released: 02/06/2005 Document Revised: 07/15/2015 Document Reviewed: 11/29/2012 Elsevier Interactive Patient Education  2017 [employer]  

## 2016-05-16 ENCOUNTER — Encounter: Payer: Self-pay | Admitting: Family Medicine

## 2016-05-16 ENCOUNTER — Ambulatory Visit (INDEPENDENT_AMBULATORY_CARE_PROVIDER_SITE_OTHER): Payer: BLUE CROSS/BLUE SHIELD | Admitting: Family Medicine

## 2016-05-16 VITALS — BP 120/84 | HR 77 | Temp 98.1°F | Wt 202.5 lb

## 2016-05-16 DIAGNOSIS — I10 Essential (primary) hypertension: Secondary | ICD-10-CM

## 2016-05-16 NOTE — Progress Notes (Signed)
Pre visit review using our clinic review tool, if applicable. No additional management support is needed unless otherwise documented below in the visit note. 

## 2016-05-16 NOTE — Patient Instructions (Signed)
Your BP is greatly improved and now at goal Try to lose some weight and establish regular exercise

## 2016-05-16 NOTE — Progress Notes (Signed)
Subjective:     Patient ID: Tyrone Jefferson, male   DOB: 12/18/1955, 61 y.o.   MRN: [id number]  HPI Patient seen for follow-up hypertension. Last visit we added amlodipine 5 mg daily. He also takes lisinopril and HCTZ. He is not monitoring blood pressure at home. He had some mild lightheadedness on a couple occasions but not consistently. Feels well overall. No chest pains. No headaches. No peripheral edema.  Past Medical History:  Diagnosis Date  . Chest pain   . Dyspnea   . Hyperlipidemia   . Hypertension   . Metabolic syndrome   . OSA (obstructive sleep apnea)    Past Surgical History:  Procedure Laterality Date  . BACK SURGERY  1992  . SPINE SURGERY  1992   L4-5 discectomy    reports that he has never smoked. He has never used smokeless tobacco. He reports that he does not drink alcohol or use drugs. family history includes Brain cancer in his mother; Colon cancer in his father; Diabetes in his mother; Heart attack (age of onset: 72) in his mother; Heart disease (age of onset: 93) in his father; Hypertension in his mother. No Known Allergies   Review of Systems  Constitutional: Negative for fatigue.  Eyes: Negative for visual disturbance.  Respiratory: Negative for cough, chest tightness and shortness of breath.   Cardiovascular: Negative for chest pain, palpitations and leg swelling.  Endocrine: Negative for polydipsia and polyuria.  Neurological: Negative for dizziness, syncope, weakness, light-headedness and headaches.       Objective:   Physical Exam  Constitutional: He is oriented to person, place, and time. He appears well-developed and well-nourished.  HENT:  Right Ear: External ear normal.  Left Ear: External ear normal.  Mouth/Throat: Oropharynx is clear and moist.  Eyes: Pupils are equal, round, and reactive to light.  Neck: Neck supple. No thyromegaly present.  Cardiovascular: Normal rate and regular rhythm.   Pulmonary/Chest: Effort normal and breath  sounds normal. No respiratory distress. He has no wheezes. He has no rales.  Musculoskeletal: He exhibits no edema.  Neurological: He is alert and oriented to person, place, and time.       Assessment:     Hypertension-greatly improved    Plan:     -Continue current medication regimen -He is encouraged to lose some weight and establish regular aerobic exercise much as possible -Routine follow-up in 6 months and sooner as needed  [name] MD  Primary Care at [facility]

## 2016-06-02 ENCOUNTER — Other Ambulatory Visit: Payer: Self-pay | Admitting: Family Medicine

## 2016-07-03 ENCOUNTER — Other Ambulatory Visit: Payer: Self-pay | Admitting: Family Medicine

## 2016-10-17 ENCOUNTER — Ambulatory Visit: Payer: Self-pay

## 2016-10-17 ENCOUNTER — Encounter: Payer: Self-pay | Admitting: Sports Medicine

## 2016-10-17 ENCOUNTER — Ambulatory Visit (INDEPENDENT_AMBULATORY_CARE_PROVIDER_SITE_OTHER): Payer: BLUE CROSS/BLUE SHIELD | Admitting: Sports Medicine

## 2016-10-17 ENCOUNTER — Ambulatory Visit (INDEPENDENT_AMBULATORY_CARE_PROVIDER_SITE_OTHER): Payer: BLUE CROSS/BLUE SHIELD

## 2016-10-17 VITALS — BP 142/88 | HR 77 | Ht 68.0 in | Wt 200.8 lb

## 2016-10-17 DIAGNOSIS — M25561 Pain in right knee: Secondary | ICD-10-CM

## 2016-10-17 DIAGNOSIS — M25461 Effusion, right knee: Secondary | ICD-10-CM

## 2016-10-17 DIAGNOSIS — M179 Osteoarthritis of knee, unspecified: Secondary | ICD-10-CM | POA: Diagnosis not present

## 2016-10-17 NOTE — Progress Notes (Signed)
OFFICE VISIT NOTE Juanda Bond. Stehanie Ekstrom, [place] [facility] [place]  Tyrone Jefferson - 61 y.o. male MRN [id number]  Date of birth: [birth year]  Visit Date: 10/17/2016  PCP: [name], MD   Referred by: [name], MD  Burlene Arnt, CMA acting as scribe for Dr. Paulla Fore.  SUBJECTIVE:   Chief Complaint  Patient presents with  . New Patient (Initial Visit)    rt knee pain   HPI: As below and per problem based documentation when appropriate.  Tyrone Jefferson is a new patient presenting today with complaint of right knee pain.  Pain started around mid June 2018. He does a lot of squatting and works on his knees a lot. Pain is worse when squatting. Pain is described as aching and is located around the patella. He does have some pain on the medial and lateral aspect off the knee. He has noticed tightness on the posterior aspect of the knee. Stretching helps to alleviate pain and tightness. Pain when squatting is about 4/10 on average. He has difficulty extending the knee when in a laying position. He has tried Advil with minimal relief. He has noticed slight swelling around the knee. He has tried using ice, heat, and elevation with some relief. He feels that the heat helped the pain more than the ice. Pt reports that about 1-2 weeks ago he had his feet planted on the ground and pivoted and heard a popping sound, that's when the pain flared up.     Review of Systems  Constitutional: Negative for chills and fever.  Respiratory: Negative for shortness of breath and wheezing.   Cardiovascular: Negative for chest pain, palpitations and leg swelling.  Musculoskeletal: Positive for joint pain. Negative for falls.  Neurological: Negative for dizziness, tingling and headaches.  Endo/Heme/Allergies: Does not bruise/bleed easily.    Otherwise per HPI.  HISTORY & PERTINENT PRIOR DATA:  No specialty comments available. He reports that he  has never smoked. He has never used smokeless tobacco. No results for input(s): HGBA1C, LABURIC in the last 8760 hours. Medications & Allergies reviewed per EMR Patient Active Problem List   Diagnosis Date Noted  . Right knee pain 10/17/2016  . Metabolic syndrome 29/52/8413  . Obesity (BMI 30-39.9) 08/07/2013  . Hyperlipidemia 05/29/2012  . OSA (obstructive sleep apnea) 06/29/2010  . Hypertension   . Chest pain   . Dyspnea    Past Medical History:  Diagnosis Date  . Chest pain   . Dyspnea   . Hyperlipidemia   . Hypertension   . Metabolic syndrome   . OSA (obstructive sleep apnea)    Family History  Problem Relation Age of Onset  . Heart attack Mother 28  . Hypertension Mother   . Diabetes Mother   . Heart disease Father 48  . Brain cancer Mother   . Colon cancer Father    Past Surgical History:  Procedure Laterality Date  . BACK SURGERY  1992  . SPINE SURGERY  1992   L4-5 discectomy   Social History   Occupational History  . route sales    Social History Main Topics  . Smoking status: Never Smoker  . Smokeless tobacco: Never Used  . Alcohol use No  . Drug use: No  . Sexual activity: Not on file    OBJECTIVE:  VS:  HT:5\' 8"  (172.7 cm)   WT:200 lb 12.8 oz (91.1 kg)  BMI:30.54    BP:(!) 142/88  HR:77bpm  TEMP: ( )  RESP:94 % EXAM: Findings:  WDWN, NAD, Non-toxic appearing Alert & appropriately interactive Not depressed or anxious appearing No increased work of breathing. Pupils are equal. EOM intact without nystagmus No clubbing or cyanosis of the extremities appreciated No significant rashes/lesions/ulcerations overlying the examined area. DP & PT pulses 2+/4.  No significant pretibial edema.  No clubbing or cyanosis Sensation intact to light touch in lower extremities.  Right Knee: Overall joint is well aligned, no significant deformity.   Moderate effusion   ROM: 3 to 115, improved after aspiration  Extensor mechanism intact No significant  medial or lateral joint line tenderness.   3 mm of opening with valgus stressing otherwise stable with stable anterior/posterior drawer. .   No mechanical symptoms with McMurray's. Pain with Thessaly..    Knee x-rays overall well aligned.  There may be a slight cortical irregularity on the lateral femoral condyle but this is incompletely visualized.  Joint spaces are well-maintained.  Minimal osteophytic spurring.  Impression: Mild OA.     US Guided Needle Placement(no Linked Charges)  Result Date: 10/17/2016 Gerda Diss, DO     10/17/2016  4:22 PM PROCEDURE NOTE - ULTRASOUND GUIDED ASPIRATION & INJECTION: Right Knee Images were obtained and interpreted by myself, Teresa Coombs, DO Images have been saved and stored to PACS system. Images obtained on: GE S7 Ultrasound machine  ULTRASOUND FINDINGS: Generalized synovitis with moderate effusion and bulging medial meniscus.  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 DESCRIPTION OF PROCEDURE: The patient's clinical condition is marked by substantial pain and/or significant functional disability. Other conservative therapy has not provided relief, is contraindicated, or not appropriate. There is a reasonable likelihood that injection will significantly improve the patient's pain and/or functional impairment. After discussing the risks, benefits and expected outcomes of the injection and all questions were reviewed and answered, the patient wished to undergo the above named procedure. Verbal consent was obtained. The ultrasound was used to identify the target structure and adjacent neurovascular structures. The skin was then prepped in sterile fashion and the target structure was injected under direct visualization using sterile technique as below: PREP: Alcohol, Ethel Chloride APPROACH: Superiolateral, stopcock technique, 21g 2" needle INJECTATE: 3 cc 1% lidocaine, 2 cc 0.5% marcaine, 2 cc 40mg  DepoMedrol ASPIRATE: 17 cc of  straw-colored serous fluid DRESSING: Band-Aid and 6" Ace-Wrap Post procedural instructions including recommending icing and warning signs for infection were reviewed. This procedure was well tolerated and there were no complications.  IMPRESSION: Succesful US Guided Aspiration & injection   ASSESSMENT & PLAN:     ICD-10-CM   1. Knee effusion, right M25.461 DG Knee AP/LAT W/Sunrise Right    US GUIDED NEEDLE PLACEMENT(NO LINKED CHARGES)  ================================================================= Right knee pain Symptoms are consistent with a degenerative medial meniscal tear with associated effusion.  MSK ultrasound confirmed swelling and irritation of the medial meniscus with interstitial tearing.  Ultrasound-guided aspiration injection performed today.  Follow-up in 4 weeks to ensure clinical resolution.  If any lack of improvement or developing mechanical symptoms will need further diagnostic evaluation with MRI to consider possible meniscectomy but hoping to avoid this.  Continue with compression while ambulatory.  Avoid exacerbating activities. =================================================================  Follow-up: Return in about 4 weeks (around 11/14/2016).   CMA/ATC served as Education administrator during this visit. History, Physical, and Plan performed by medical provider. Documentation and orders reviewed and attested to.      Teresa Coombs, [facility] Physician

## 2016-10-17 NOTE — Patient Instructions (Signed)

## 2016-10-17 NOTE — Procedures (Signed)
PROCEDURE NOTE - ULTRASOUND GUIDED ASPIRATION & INJECTION: Right Knee Images were obtained and interpreted by myself, Teresa Coombs, DO  Images have been saved and stored to PACS system. Images obtained on: GE S7 Ultrasound machine  ULTRASOUND FINDINGS: Generalized synovitis with moderate effusion and bulging medial meniscus.  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  DESCRIPTION OF PROCEDURE:  The patient's clinical condition is marked by substantial pain and/or significant functional disability. Other conservative therapy has not provided relief, is contraindicated, or not appropriate. There is a reasonable likelihood that injection will significantly improve the patient's pain and/or functional impairment. After discussing the risks, benefits and expected outcomes of the injection and all questions were reviewed and answered, the patient wished to undergo the above named procedure. Verbal consent was obtained. The ultrasound was used to identify the target structure and adjacent neurovascular structures. The skin was then prepped in sterile fashion and the target structure was injected under direct visualization using sterile technique as below: PREP: Alcohol, Ethel Chloride APPROACH: Superiolateral, stopcock technique, 21g 2" needle INJECTATE: 3 cc 1% lidocaine, 2 cc 0.5% marcaine, 2 cc 40mg  DepoMedrol ASPIRATE: 17 cc of straw-colored serous fluid DRESSING: Band-Aid and 6" Ace-Wrap  Post procedural instructions including recommending icing and warning signs for infection were reviewed. This procedure was well tolerated and there were no complications.   IMPRESSION: Succesful US Guided Aspiration & injection

## 2016-10-17 NOTE — Assessment & Plan Note (Signed)
Symptoms are consistent with a degenerative medial meniscal tear with associated effusion.  MSK ultrasound confirmed swelling and irritation of the medial meniscus with interstitial tearing.  Ultrasound-guided aspiration injection performed today.  Follow-up in 4 weeks to ensure clinical resolution.  If any lack of improvement or developing mechanical symptoms will need further diagnostic evaluation with MRI to consider possible meniscectomy but hoping to avoid this.  Continue with compression while ambulatory.  Avoid exacerbating activities.

## 2016-10-19 ENCOUNTER — Ambulatory Visit: Payer: BLUE CROSS/BLUE SHIELD | Admitting: Sports Medicine

## 2016-11-16 ENCOUNTER — Ambulatory Visit: Payer: BLUE CROSS/BLUE SHIELD | Admitting: Sports Medicine

## 2016-11-23 ENCOUNTER — Encounter: Payer: Self-pay | Admitting: Sports Medicine

## 2016-11-23 ENCOUNTER — Ambulatory Visit (INDEPENDENT_AMBULATORY_CARE_PROVIDER_SITE_OTHER): Payer: BLUE CROSS/BLUE SHIELD | Admitting: Sports Medicine

## 2016-11-23 VITALS — BP 140/72 | HR 71 | Ht 68.0 in | Wt 202.6 lb

## 2016-11-23 DIAGNOSIS — M25461 Effusion, right knee: Secondary | ICD-10-CM

## 2016-11-23 MED ORDER — DICLOFENAC SODIUM 2 % TD SOLN: 1.0000 "application " | Freq: Two times a day (BID) | TRANSDERMAL | 0 refills | Status: AC

## 2016-11-23 NOTE — Assessment & Plan Note (Signed)
Effusion has essentially resolved.  Still only slight pain with terminal flexion while weightbearing in a squatted position.  Overall 90% improved.  We will plan to have him continue avoiding exacerbating activities and trial of Pennsaid provided.  He will call if he would like a prescription for this.  Otherwise plan follow-up with him if any worsening symptoms.

## 2016-11-23 NOTE — Progress Notes (Signed)
OFFICE VISIT NOTE Tyrone Jefferson, [place] [facility] [place]  Tyrone Jefferson - 61 y.o. male MRN [id number]  Date of birth: Jan 23, 1956  Visit Date: 11/23/2016  PCP: Tyrone Post, MD   Referred by: Tyrone Post, MD  Tyrone Jefferson PT, LAT, ATC acting as scribe for Dr. Paulla Fore.  SUBJECTIVE:   Chief Complaint  Patient presents with  . Follow-up    R knee effusion   HPI: As below and per problem based documentation when appropriate.  Tyrone Jefferson is an established patient presenting today in follow-up of RT knee effusion. He was last seen 10/17/2016 and xray of the RT knee was done. He received steroid injection and 17 cc of synovial fluid was aspirated. He was advised to avoid exacerbating activities and to use compression while amblatory.  Pt states that overall his R knee is feeling better but is a little up and down.  He notes over the past 2 weeks, the pain has subsided and he notes improved R knee ROM/mobility.  He states that the aching pain has subsided.  He reports that normal squatting does not bother him but he still reports pain w/ deep squatting/deep knee flexion.  He notes that his current pain level is a 1/10 and rates his improvement around 90%.       Review of Systems  Constitutional: Negative for chills, fever and weight loss.  HENT: Negative.   Eyes: Negative.   Respiratory: Negative for cough, shortness of breath and wheezing.   Cardiovascular: Negative for chest pain and palpitations.  Gastrointestinal: Negative for abdominal pain, heartburn and nausea.  Genitourinary: Negative.   Musculoskeletal: Positive for joint pain. Negative for falls.  Neurological: Negative for dizziness, tingling and headaches.  Endo/Heme/Allergies: Does not bruise/bleed easily.  Psychiatric/Behavioral: Negative for depression. The patient is not nervous/anxious and does not have insomnia.     Otherwise per  HPI.  HISTORY & PERTINENT PRIOR DATA:  No specialty comments available. He reports that he has never smoked. He has never used smokeless tobacco. No results for input(s): HGBA1C, LABURIC in the last 8760 hours. Allergies reviewed per EMR Prior to Admission medications   Medication Sig Start Date End Date Taking? Authorizing Provider  amLODipine (NORVASC) 5 MG tablet Take 1 tablet (5 mg total) by mouth daily. 04/07/16   Burchette, Alinda Sierras, MD  Diclofenac Sodium (PENNSAID) 2 % SOLN Place 1 application onto the skin 2 (two) times daily. 11/23/16 11/24/16  Gerda Diss, DO  hydrochlorothiazide (HYDRODIURIL) 25 MG tablet TAKE 1 TABLET BY MOUTH EVERY DAY 06/02/16   Burchette, Alinda Sierras, MD  lisinopril (PRINIVIL,ZESTRIL) 20 MG tablet TAKE 1 TABLET EVERY DAY 07/03/16   Burchette, Alinda Sierras, MD   Patient Active Problem List   Diagnosis Date Noted  . Knee effusion, right 11/23/2016  . Right knee pain 10/17/2016  . Metabolic syndrome 88/41/6606  . Obesity (BMI 30-39.9) 08/07/2013  . Hyperlipidemia 05/29/2012  . OSA (obstructive sleep apnea) 06/29/2010  . Hypertension   . Chest pain   . Dyspnea    Past Medical History:  Diagnosis Date  . Chest pain   . Dyspnea   . Hyperlipidemia   . Hypertension   . Metabolic syndrome   . OSA (obstructive sleep apnea)    Family History  Problem Relation Age of Onset  . Heart attack Mother 41  . Hypertension Mother   . Diabetes Mother   . Heart disease Father  70  . Brain cancer Mother   . Colon cancer Father    Past Surgical History:  Procedure Laterality Date  . BACK SURGERY  1992  . SPINE SURGERY  1992   L4-5 discectomy   Social History   Occupational History  . route sales    Social History Main Topics  . Smoking status: Never Smoker  . Smokeless tobacco: Never Used  . Alcohol use No  . Drug use: No  . Sexual activity: Not on file    OBJECTIVE:  VS:  HT:5\' 8"  (172.7 cm)   WT:202 lb 9.6 oz (91.9 kg)  BMI:30.81    BP:140/72  HR:71bpm   TEMP: ( )  RESP:95 % EXAM: Findings:  Right knee is overall well aligned.  No effusion.  He is ligamentously stable.  No pain with McMurray's.    RADIOLOGY: DG Knee AP/LAT W/Sunrise Right CLINICAL DATA:  Right knee pain since June.  No injury.  EXAM: RIGHT KNEE 3 VIEWS  COMPARISON:  None.  FINDINGS: No evidence of fracture, dislocation. Suprapatellar effusion is identified. Mild narrowed medial femoral tibial joint spaces are identified bilaterally.  IMPRESSION: Right suprapatellar effusion noted. 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  Electronically Signed   By: Abelardo Diesel M.D.   On: 10/17/2016 19:03 US GUIDED NEEDLE PLACEMENT(NO LINKED CHARGES) Gerda Diss, DO     10/17/2016  4:22 PM PROCEDURE NOTE - ULTRASOUND GUIDED ASPIRATION & INJECTION: Right  Knee Images were obtained and interpreted by myself, Teresa Coombs, DO   Images have been saved and stored to PACS system. Images obtained on: GE S7 Ultrasound machine  ULTRASOUND FINDINGS: Generalized synovitis with moderate effusion  and bulging medial meniscus.  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  DESCRIPTION OF PROCEDURE:  The patient's clinical condition is marked by substantial pain  and/or significant functional disability. Other conservative  therapy has not provided relief, is contraindicated, or not  appropriate. There is a reasonable likelihood that injection will  significantly improve the patient's pain and/or functional  impairment. After discussing the risks, benefits and expected  outcomes of the injection and all questions were reviewed and  answered, the patient wished to undergo the above named  procedure. Verbal consent was obtained. The ultrasound was used  to identify the target structure and adjacent neurovascular  structures. The skin was then prepped in sterile fashion and the  target structure was injected under direct visualization using   sterile technique as below: PREP: Alcohol, Ethel Chloride APPROACH: Superiolateral, stopcock technique, 21g 2" needle INJECTATE: 3 cc 1% lidocaine, 2 cc 0.5% marcaine, 2 cc 40mg   DepoMedrol ASPIRATE: 17 cc of straw-colored serous fluid DRESSING: Band-Aid and 6" Ace-Wrap  Jefferson procedural instructions including recommending icing and  warning signs for infection were reviewed. This procedure was  well tolerated and there were no complications.   IMPRESSION: Succesful US Guided Aspiration & injection  ASSESSMENT & PLAN:     ICD-10-CM   1. Knee effusion, right M25.461 Diclofenac Sodium (PENNSAID) 2 % SOLN   ================================================================= Knee effusion, right Effusion has essentially resolved.  Still only slight pain with terminal flexion while weightbearing in a squatted position.  Overall 90% improved.  We will plan to have him continue avoiding exacerbating activities and trial of Pennsaid provided.  He will call if he would like a prescription for this.  Otherwise plan follow-up with him if any worsening symptoms.  Follow-up: Return if symptoms worsen or fail to improve.  CMA/ATC served as Education administrator during this visit. History, Physical, and Plan performed by medical provider. Documentation and orders reviewed and attested to.      Teresa Coombs, [facility] Physician

## 2016-11-26 ENCOUNTER — Other Ambulatory Visit: Payer: Self-pay | Admitting: Family Medicine

## 2017-02-27 ENCOUNTER — Other Ambulatory Visit: Payer: Self-pay | Admitting: Family Medicine

## 2017-03-29 ENCOUNTER — Other Ambulatory Visit: Payer: Self-pay | Admitting: Family Medicine

## 2017-05-30 ENCOUNTER — Other Ambulatory Visit: Payer: Self-pay | Admitting: Family Medicine

## 2017-08-09 ENCOUNTER — Encounter: Payer: Self-pay | Admitting: Gastroenterology

## 2017-08-28 ENCOUNTER — Other Ambulatory Visit: Payer: Self-pay | Admitting: Family Medicine

## 2017-09-19 ENCOUNTER — Ambulatory Visit (INDEPENDENT_AMBULATORY_CARE_PROVIDER_SITE_OTHER): Payer: BLUE CROSS/BLUE SHIELD | Admitting: Family Medicine

## 2017-09-19 ENCOUNTER — Encounter: Payer: Self-pay | Admitting: Family Medicine

## 2017-09-19 VITALS — BP 132/80 | HR 83 | Temp 98.7°F | Ht 69.0 in | Wt 197.1 lb

## 2017-09-19 DIAGNOSIS — Z0000 Encounter for general adult medical examination without abnormal findings: Secondary | ICD-10-CM

## 2017-09-19 NOTE — Patient Instructions (Signed)
Consider new shingles vaccine and let us know if interested  Need to consider repeat colonoscopy vs Cologuard.  Let me know which way you decide to go.

## 2017-09-19 NOTE — Progress Notes (Signed)
Subjective:     Patient ID: Tyrone Jefferson, male   DOB: [birth year], 62 y.o.   MRN: [id number]  HPI Patient seen for physical exam. He has history of hypertension treated with amlodipine, lisinopril, and HCTZ. History of mild hyperlipidemia.  Health maintenance reviewed. Tetanus is up-to-date. No history of hepatitis C screening. Last colonoscopy was about 10 years ago which was normal. No history of polyps. He is undecided regarding colonoscopy repeat versus cologuard  Past Medical History:  Diagnosis Date  . Chest pain   . Dyspnea   . Hyperlipidemia   . Hypertension   . Metabolic syndrome   . OSA (obstructive sleep apnea)    Past Surgical History:  Procedure Laterality Date  . BACK SURGERY  1992  . SPINE SURGERY  1992   L4-5 discectomy    reports that he has never smoked. He has never used smokeless tobacco. He reports that he does not drink alcohol or use drugs. family history includes Brain cancer in his mother; Colon cancer in his father; Diabetes in his mother; Heart attack (age of onset: 66) in his mother; Heart disease (age of onset: 33) in his father; Hypertension in his mother. No Known Allergies   Review of Systems  Constitutional: Negative for activity change, appetite change, fatigue, fever and unexpected weight change.  HENT: Negative for congestion, ear pain and trouble swallowing.   Eyes: Negative for pain and visual disturbance.  Respiratory: Positive for cough. Negative for shortness of breath and wheezing.   Cardiovascular: Negative for chest pain and palpitations.  Gastrointestinal: Negative for abdominal distention, abdominal pain, blood in stool, constipation, diarrhea, nausea, rectal pain and vomiting.  Endocrine: Negative for polydipsia and polyuria.  Genitourinary: Negative for dysuria, hematuria and testicular pain.  Musculoskeletal: Negative for arthralgias and joint swelling.  Skin: Negative for rash.  Neurological: Negative for dizziness, syncope  and headaches.  Hematological: Negative for adenopathy.  Psychiatric/Behavioral: Negative for confusion and dysphoric mood.       Objective:   Physical Exam  Constitutional: He is oriented to person, place, and time. He appears well-developed and well-nourished. No distress.  HENT:  Head: Normocephalic and atraumatic.  Right Ear: External ear normal.  Left Ear: External ear normal.  Mouth/Throat: Oropharynx is clear and moist.  Eyes: Pupils are equal, round, and reactive to light. Conjunctivae and EOM are normal.  Neck: Normal range of motion. Neck supple. No thyromegaly present.  Cardiovascular: Normal rate, regular rhythm and normal heart sounds.  No murmur heard. Pulmonary/Chest: No respiratory distress. He has no wheezes. He has no rales.  Abdominal: Soft. Bowel sounds are normal. He exhibits no distension and no mass. There is no tenderness. There is no rebound and no guarding.  Musculoskeletal: He exhibits no edema.  Lymphadenopathy:    He has no cervical adenopathy.  Neurological: He is alert and oriented to person, place, and time. He displays normal reflexes. No cranial nerve deficit.  Skin: No rash noted.  Psychiatric: He has a normal mood and affect.       Assessment:     Physical exam. Patient has history of hypertension treated with 3 drug regimen of fairly well controlled. We discussed the following issues today    Plan:     -Check lab work. Include hepatitis C antibody which he's not had done previously -Recommend repeat colon cancer screening. He is undecided regarding cologuard and colonoscopy. We discussed pros and cons of each. He'll get back with me when he decides over the next  couple weeks -Discussed new shingles vaccine and he will get back after he checks with insurance coverage.  [name] MD [facility] at [facility]

## 2017-09-20 LAB — CBC WITH DIFFERENTIAL/PLATELET
BASOS PCT: 0.8 % (ref 0.0–3.0)
Basophils Absolute: 0.1 10*3/uL (ref 0.0–0.1)
Eosinophils Absolute: 0.1 10*3/uL (ref 0.0–0.7)
Eosinophils Relative: 0.9 % (ref 0.0–5.0)
HCT: 44.9 % (ref 39.0–52.0)
HEMOGLOBIN: 15.9 g/dL (ref 13.0–17.0)
Lymphocytes Relative: 28.6 % (ref 12.0–46.0)
Lymphs Abs: 2.3 10*3/uL (ref 0.7–4.0)
MCHC: 35.4 g/dL (ref 30.0–36.0)
MCV: 85.4 fl (ref 78.0–100.0)
MONOS PCT: 9.7 % (ref 3.0–12.0)
Monocytes Absolute: 0.8 10*3/uL (ref 0.1–1.0)
Neutro Abs: 4.9 10*3/uL (ref 1.4–7.7)
Neutrophils Relative %: 60 % (ref 43.0–77.0)
Platelets: 271 10*3/uL (ref 150.0–400.0)
RBC: 5.25 Mil/uL (ref 4.22–5.81)
RDW: 13.5 % (ref 11.5–15.5)
WBC: 8.2 10*3/uL (ref 4.0–10.5)

## 2017-09-20 LAB — PSA: PSA: 2.21 ng/mL (ref 0.10–4.00)

## 2017-09-20 LAB — LIPID PANEL
CHOL/HDL RATIO: 5
CHOLESTEROL: 200 mg/dL (ref 0–200)
HDL: 37.5 mg/dL — ABNORMAL LOW (ref >39.00)
NonHDL: 162.66
TRIGLYCERIDES: 304 mg/dL — AB (ref 0.0–149.0)
VLDL: 60.8 mg/dL — ABNORMAL HIGH (ref 0.0–40.0)

## 2017-09-20 LAB — BASIC METABOLIC PANEL
BUN: 21 mg/dL (ref 6–23)
CHLORIDE: 99 meq/L (ref 96–112)
CO2: 31 mEq/L (ref 19–32)
CREATININE: 0.95 mg/dL (ref 0.40–1.50)
Calcium: 9.3 mg/dL (ref 8.4–10.5)
GFR: 85.53 mL/min (ref >60.00)
Glucose, Bld: 96 mg/dL (ref 70–99)
POTASSIUM: 3.6 meq/L (ref 3.5–5.1)
Sodium: 139 mEq/L (ref 135–145)

## 2017-09-20 LAB — LDL CHOLESTEROL, DIRECT: Direct LDL: 121 mg/dL

## 2017-09-20 LAB — HEPATIC FUNCTION PANEL
ALBUMIN: 4.5 g/dL (ref 3.5–5.2)
ALT: 11 U/L (ref 0–53)
AST: 13 U/L (ref 0–37)
Alkaline Phosphatase: 52 U/L (ref 39–117)
BILIRUBIN TOTAL: 0.6 mg/dL (ref 0.2–1.2)
Bilirubin, Direct: 0.1 mg/dL (ref 0.0–0.3)
Total Protein: 6.9 g/dL (ref 6.0–8.3)

## 2017-09-20 LAB — HEPATITIS C ANTIBODY
Hepatitis C Ab: NONREACTIVE
SIGNAL TO CUT-OFF: 0.04 (ref <1.00)

## 2017-09-20 LAB — TSH: TSH: 3.47 u[IU]/mL (ref 0.35–4.50)

## 2017-09-28 ENCOUNTER — Other Ambulatory Visit: Payer: Self-pay | Admitting: Family Medicine

## 2017-11-24 ENCOUNTER — Other Ambulatory Visit: Payer: Self-pay | Admitting: Family Medicine

## 2018-02-26 ENCOUNTER — Other Ambulatory Visit: Payer: Self-pay | Admitting: Family Medicine

## 2018-03-27 ENCOUNTER — Other Ambulatory Visit: Payer: Self-pay | Admitting: Family Medicine

## 2018-03-29 ENCOUNTER — Other Ambulatory Visit: Payer: Self-pay | Admitting: Family Medicine

## 2018-05-27 ENCOUNTER — Other Ambulatory Visit: Payer: Self-pay | Admitting: Family Medicine

## 2018-08-22 ENCOUNTER — Other Ambulatory Visit: Payer: Self-pay | Admitting: Family Medicine

## 2018-08-26 ENCOUNTER — Other Ambulatory Visit: Payer: Self-pay | Admitting: Family Medicine

## 2018-08-26 ENCOUNTER — Other Ambulatory Visit: Payer: Self-pay | Admitting: *Deleted

## 2018-08-26 MED ORDER — HYDROCHLOROTHIAZIDE 25 MG PO TABS: 25.0000 mg | ORAL_TABLET | Freq: Every day | ORAL | 0 refills | Status: DC

## 2018-08-26 NOTE — Telephone Encounter (Signed)
30 day supply given. Called patient. LVM for patient informing them they need schedule an appointment

## 2018-08-30 ENCOUNTER — Telehealth: Payer: Self-pay | Admitting: Family Medicine

## 2018-08-30 ENCOUNTER — Other Ambulatory Visit: Payer: Self-pay

## 2018-08-30 MED ORDER — LISINOPRIL 20 MG PO TABS: 20.0000 mg | ORAL_TABLET | Freq: Every day | ORAL | 0 refills | Status: DC

## 2018-08-30 NOTE — Telephone Encounter (Signed)
Patient is calling about a medication that he needs refill. Patient states that he was told that he couldn't get it refill until he comes in to see Dr. Elease Hashimoto for Physical. I did schedule physical for 11/08/2018 but patient will not have rx left. The medication that needs refill is Hydrochlorothiazide 25 mg and Lisinopril 20 mg. Could some be called in until his appt in Sept? Or we can try to move appt up. Please advise

## 2018-08-30 NOTE — Telephone Encounter (Signed)
He has 30 day refill for now. I will call him as soon as I can to let him know if we need a virtual visit until he comes in the office.

## 2018-10-23 ENCOUNTER — Other Ambulatory Visit: Payer: Self-pay | Admitting: Family Medicine

## 2018-10-23 NOTE — Telephone Encounter (Signed)
Patient has an appointment coming up and needs before medication can be sent.

## 2018-10-25 NOTE — Telephone Encounter (Signed)
Medication: pt states that he would also like medication lisinopril (ZESTRIL) 20 MG tablet [id number]  hydrochlorothiazide (HYDRODIURIL) 25 MG tablet [id number]    Pt would like to know if enough could be called in until next appointment. Please advise.

## 2018-10-25 NOTE — Telephone Encounter (Signed)
Refills OK until he can get appt.

## 2018-10-25 NOTE — Telephone Encounter (Signed)
See request °

## 2018-10-27 ENCOUNTER — Other Ambulatory Visit: Payer: Self-pay | Admitting: Family Medicine

## 2018-10-29 DIAGNOSIS — M9903 Segmental and somatic dysfunction of lumbar region: Secondary | ICD-10-CM | POA: Diagnosis not present

## 2018-10-29 DIAGNOSIS — M9904 Segmental and somatic dysfunction of sacral region: Secondary | ICD-10-CM | POA: Diagnosis not present

## 2018-10-29 DIAGNOSIS — M9905 Segmental and somatic dysfunction of pelvic region: Secondary | ICD-10-CM | POA: Diagnosis not present

## 2018-10-29 DIAGNOSIS — M7918 Myalgia, other site: Secondary | ICD-10-CM | POA: Diagnosis not present

## 2018-11-01 DIAGNOSIS — M9904 Segmental and somatic dysfunction of sacral region: Secondary | ICD-10-CM | POA: Diagnosis not present

## 2018-11-01 DIAGNOSIS — M7918 Myalgia, other site: Secondary | ICD-10-CM | POA: Diagnosis not present

## 2018-11-01 DIAGNOSIS — M9905 Segmental and somatic dysfunction of pelvic region: Secondary | ICD-10-CM | POA: Diagnosis not present

## 2018-11-01 DIAGNOSIS — M9903 Segmental and somatic dysfunction of lumbar region: Secondary | ICD-10-CM | POA: Diagnosis not present

## 2018-11-08 ENCOUNTER — Encounter: Payer: BLUE CROSS/BLUE SHIELD | Admitting: Family Medicine

## 2018-11-11 ENCOUNTER — Ambulatory Visit (INDEPENDENT_AMBULATORY_CARE_PROVIDER_SITE_OTHER): Payer: BC Managed Care – PPO | Admitting: Family Medicine

## 2018-11-11 ENCOUNTER — Encounter: Payer: Self-pay | Admitting: Family Medicine

## 2018-11-11 ENCOUNTER — Other Ambulatory Visit: Payer: Self-pay

## 2018-11-11 VITALS — BP 140/80 | HR 76 | Temp 98.1°F | Ht 68.0 in | Wt 203.8 lb

## 2018-11-11 DIAGNOSIS — Z125 Encounter for screening for malignant neoplasm of prostate: Secondary | ICD-10-CM | POA: Diagnosis not present

## 2018-11-11 DIAGNOSIS — Z23 Encounter for immunization: Secondary | ICD-10-CM | POA: Diagnosis not present

## 2018-11-11 DIAGNOSIS — Z0000 Encounter for general adult medical examination without abnormal findings: Secondary | ICD-10-CM

## 2018-11-11 LAB — CBC WITH DIFFERENTIAL/PLATELET
Basophils Absolute: 0 10*3/uL (ref 0.0–0.1)
Basophils Relative: 0.5 % (ref 0.0–3.0)
Eosinophils Absolute: 0.1 10*3/uL (ref 0.0–0.7)
Eosinophils Relative: 1.2 % (ref 0.0–5.0)
HCT: 44.6 % (ref 39.0–52.0)
Hemoglobin: 15.7 g/dL (ref 13.0–17.0)
Lymphocytes Relative: 31.5 % (ref 12.0–46.0)
Lymphs Abs: 2.6 10*3/uL (ref 0.7–4.0)
MCHC: 35.2 g/dL (ref 30.0–36.0)
MCV: 85.2 fl (ref 78.0–100.0)
Monocytes Absolute: 0.9 10*3/uL (ref 0.1–1.0)
Monocytes Relative: 10.6 % (ref 3.0–12.0)
Neutro Abs: 4.7 10*3/uL (ref 1.4–7.7)
Neutrophils Relative %: 56.2 % (ref 43.0–77.0)
Platelets: 282 10*3/uL (ref 150.0–400.0)
RBC: 5.24 Mil/uL (ref 4.22–5.81)
RDW: 13.5 % (ref 11.5–15.5)
WBC: 8.3 10*3/uL (ref 4.0–10.5)

## 2018-11-11 LAB — HEPATIC FUNCTION PANEL
ALT: 16 U/L (ref 0–53)
AST: 18 U/L (ref 0–37)
Albumin: 4.5 g/dL (ref 3.5–5.2)
Alkaline Phosphatase: 62 U/L (ref 39–117)
Bilirubin, Direct: 0.1 mg/dL (ref 0.0–0.3)
Total Bilirubin: 0.6 mg/dL (ref 0.2–1.2)
Total Protein: 7.2 g/dL (ref 6.0–8.3)

## 2018-11-11 LAB — LIPID PANEL
Cholesterol: 206 mg/dL — ABNORMAL HIGH (ref 0–200)
HDL: 33.4 mg/dL — ABNORMAL LOW (ref >39.00)
NonHDL: 172.94
Total CHOL/HDL Ratio: 6
Triglycerides: 282 mg/dL — ABNORMAL HIGH (ref 0.0–149.0)
VLDL: 56.4 mg/dL — ABNORMAL HIGH (ref 0.0–40.0)

## 2018-11-11 LAB — BASIC METABOLIC PANEL
BUN: 16 mg/dL (ref 6–23)
CO2: 35 mEq/L — ABNORMAL HIGH (ref 19–32)
Calcium: 9.5 mg/dL (ref 8.4–10.5)
Chloride: 95 mEq/L — ABNORMAL LOW (ref 96–112)
Creatinine, Ser: 0.85 mg/dL (ref 0.40–1.50)
GFR: 91.16 mL/min (ref >60.00)
Glucose, Bld: 103 mg/dL — ABNORMAL HIGH (ref 70–99)
Potassium: 3.3 mEq/L — ABNORMAL LOW (ref 3.5–5.1)
Sodium: 139 mEq/L (ref 135–145)

## 2018-11-11 LAB — PSA: PSA: 2.91 ng/mL (ref 0.10–4.00)

## 2018-11-11 LAB — TSH: TSH: 3.27 u[IU]/mL (ref 0.35–4.50)

## 2018-11-11 LAB — LDL CHOLESTEROL, DIRECT: Direct LDL: 142 mg/dL

## 2018-11-11 NOTE — Progress Notes (Addendum)
Subjective:     Patient ID: Tyrone Jefferson, male   DOB: 25-Sep-1955, 63 y.o.   MRN: [id number]  HPI Stacey is seen for physical exam.  He just retired this past year and has enjoyed retirement though he had planned to do some volunteering and join a [employer] but those things have been delayed because of the pandemic.  He has history of hypertension which is currently treated with 3 drug regimen.  He has gained some weight since his retirement.  He is walking some for exercise.  Health maintenance reviewed.  He needs flu vaccine.  He is overdue for colon cancer screening.  No history of shingles vaccine.  Tetanus is up-to-date  The 10-year ASCVD risk score Mikey Bussing DC Brooke Bonito., et al., 2013) is: 18.1%   Values used to calculate the score:     Age: 9 years     Sex: Male     Is Non-Hispanic African American: No     Diabetic: No     Tobacco smoker: No     Systolic Blood Pressure: [id number] mmHg     Is BP treated: Yes     HDL Cholesterol: 33.4 mg/dL     Total Cholesterol: 206 mg/dL   Past Medical History:  Diagnosis Date  . Chest pain   . Dyspnea   . Hyperlipidemia   . Hypertension   . Metabolic syndrome   . OSA (obstructive sleep apnea)    Past Surgical History:  Procedure Laterality Date  . BACK SURGERY  1992  . SPINE SURGERY  1992   L4-5 discectomy    reports that he has never smoked. He has never used smokeless tobacco. He reports that he does not drink alcohol or use drugs. family history includes Brain cancer in his mother; Colon cancer in his father; Diabetes in his mother; Heart attack (age of onset: 60) in his mother; Heart disease (age of onset: 79) in his father; Hypertension in his mother. No Known Allergies   Review of Systems  Constitutional: Negative for activity change, appetite change, fatigue, fever and unexpected weight change.  HENT: Negative for congestion, ear pain and trouble swallowing.   Eyes: Negative for pain and visual disturbance.  Respiratory: Negative for  cough, shortness of breath and wheezing.   Cardiovascular: Negative for chest pain and palpitations.  Gastrointestinal: Negative for abdominal distention, abdominal pain, blood in stool, constipation, diarrhea, nausea, rectal pain and vomiting.  Endocrine: Negative for polydipsia and polyuria.  Genitourinary: Negative for dysuria, hematuria and testicular pain.  Musculoskeletal: Negative for arthralgias and joint swelling.  Skin: Negative for rash.  Neurological: Negative for dizziness, syncope and headaches.  Hematological: Negative for adenopathy.  Psychiatric/Behavioral: Negative for confusion and dysphoric mood.       Objective:   Physical Exam Constitutional:      General: He is not in acute distress.    Appearance: He is well-developed.  HENT:     Head: Normocephalic and atraumatic.     Right Ear: External ear normal.     Left Ear: External ear normal.  Eyes:     Conjunctiva/sclera: Conjunctivae normal.     Pupils: Pupils are equal, round, and reactive to light.  Neck:     Musculoskeletal: Normal range of motion and neck supple.     Thyroid: No thyromegaly.  Cardiovascular:     Rate and Rhythm: Normal rate and regular rhythm.     Heart sounds: Normal heart sounds. No murmur.  Pulmonary:     Effort:  No respiratory distress.     Breath sounds: No wheezing or rales.  Abdominal:     General: Bowel sounds are normal. There is no distension.     Palpations: Abdomen is soft. There is no mass.     Tenderness: There is no abdominal tenderness. There is no guarding or rebound.  Musculoskeletal:     Right lower leg: No edema.     Left lower leg: No edema.  Lymphadenopathy:     Cervical: No cervical adenopathy.  Skin:    Findings: No rash.  Neurological:     Mental Status: He is alert and oriented to person, place, and time.     Cranial Nerves: No cranial nerve deficit.     Deep Tendon Reflexes: Reflexes normal.        Assessment:     Physical exam.  He has  hypertension which is slightly high today and likely reflective of his recent weight gain    Plan:     -Flu vaccine given -Patient requests Cologuard.  He has no history of polyps and no family history of colon cancer.  This will be ordered.  He is aware of approximately 90% specificity and sensitivity and what these mean with cologuard -Obtain follow-up screening labs -He is strongly encouraged to lose weight and start more consistent exercise and follow-up in 2 months to reassess blood pressure.  If not better controlled that point consider adjusting dosages versus additional medication  [name] MD [facility] at [facility]

## 2018-11-11 NOTE — Addendum Note (Signed)
Addended by: Anibal Henderson on: 11/11/2018 03:01 PM   Modules accepted: Orders

## 2018-11-11 NOTE — Patient Instructions (Addendum)
Consider shingles vaccine (Shingrix) and check on insurance coverage if interested.    Try to lose some weight and let's plan on rechecking blood pressure in 2-3 months.

## 2018-11-12 DIAGNOSIS — M9904 Segmental and somatic dysfunction of sacral region: Secondary | ICD-10-CM | POA: Diagnosis not present

## 2018-11-12 DIAGNOSIS — M7918 Myalgia, other site: Secondary | ICD-10-CM | POA: Diagnosis not present

## 2018-11-12 DIAGNOSIS — M9903 Segmental and somatic dysfunction of lumbar region: Secondary | ICD-10-CM | POA: Diagnosis not present

## 2018-11-12 DIAGNOSIS — M9905 Segmental and somatic dysfunction of pelvic region: Secondary | ICD-10-CM | POA: Diagnosis not present

## 2018-11-13 ENCOUNTER — Other Ambulatory Visit: Payer: Self-pay | Admitting: Family Medicine

## 2018-11-13 DIAGNOSIS — R972 Elevated prostate specific antigen [PSA]: Secondary | ICD-10-CM

## 2018-11-13 DIAGNOSIS — E7849 Other hyperlipidemia: Secondary | ICD-10-CM

## 2018-11-13 MED ORDER — ATORVASTATIN CALCIUM 20 MG PO TABS: 20.0000 mg | ORAL_TABLET | Freq: Every day | ORAL | 3 refills | Status: DC

## 2018-11-21 DIAGNOSIS — Z1212 Encounter for screening for malignant neoplasm of rectum: Secondary | ICD-10-CM | POA: Diagnosis not present

## 2018-11-21 DIAGNOSIS — Z1211 Encounter for screening for malignant neoplasm of colon: Secondary | ICD-10-CM | POA: Diagnosis not present

## 2018-11-21 LAB — COLOGUARD: Cologuard: POSITIVE — AB

## 2018-11-22 ENCOUNTER — Other Ambulatory Visit: Payer: Self-pay | Admitting: Family Medicine

## 2018-11-26 DIAGNOSIS — M9905 Segmental and somatic dysfunction of pelvic region: Secondary | ICD-10-CM | POA: Diagnosis not present

## 2018-11-26 DIAGNOSIS — M9904 Segmental and somatic dysfunction of sacral region: Secondary | ICD-10-CM | POA: Diagnosis not present

## 2018-11-26 DIAGNOSIS — M7918 Myalgia, other site: Secondary | ICD-10-CM | POA: Diagnosis not present

## 2018-11-26 DIAGNOSIS — M9903 Segmental and somatic dysfunction of lumbar region: Secondary | ICD-10-CM | POA: Diagnosis not present

## 2018-12-02 ENCOUNTER — Telehealth: Payer: Self-pay | Admitting: *Deleted

## 2018-12-02 DIAGNOSIS — R195 Other fecal abnormalities: Secondary | ICD-10-CM

## 2018-12-02 NOTE — Telephone Encounter (Signed)
Phone call to [place] at [facility] to confirm receipt of TE re: positive Cologuard test.  Spoke with Arbie Cookey.  She was able to confirm receipt of Telephone Encounter routed to the [facility], for Dr. Elease Hashimoto to be made aware.

## 2018-12-02 NOTE — Telephone Encounter (Signed)
Tyrone Jefferson., Provider Support Specialist, called to verify report for positive cologuard on 10/7/ 2020 was received; the kit was delivered to pt on 11/21/2018; the order was placed for the kit 11/13/2018; he has a fax number to office as [phone]; information repeated for accuracy; he will re-fax the information; the pt sees Dr Elease Hashimoto, [facility]; attempted to notify office x 2 but no answer; will route for notification

## 2018-12-03 ENCOUNTER — Encounter: Payer: Self-pay | Admitting: Gastroenterology

## 2018-12-03 DIAGNOSIS — M9904 Segmental and somatic dysfunction of sacral region: Secondary | ICD-10-CM | POA: Diagnosis not present

## 2018-12-03 DIAGNOSIS — M9905 Segmental and somatic dysfunction of pelvic region: Secondary | ICD-10-CM | POA: Diagnosis not present

## 2018-12-03 DIAGNOSIS — M7918 Myalgia, other site: Secondary | ICD-10-CM | POA: Diagnosis not present

## 2018-12-03 DIAGNOSIS — M9903 Segmental and somatic dysfunction of lumbar region: Secondary | ICD-10-CM | POA: Diagnosis not present

## 2018-12-03 NOTE — Telephone Encounter (Signed)
I have not seen yet- but if positive, pt needs to referral to GI at this time   Go ahead and set up.

## 2018-12-03 NOTE — Addendum Note (Signed)
Addended by: Benson Setting L on: 12/03/2018 01:31 PM   Modules accepted: Orders

## 2018-12-03 NOTE — Telephone Encounter (Addendum)
Dr. Elease Hashimoto found report and informed me in clinic that he has spoken with pt. Referral was placed per his request.

## 2018-12-17 DIAGNOSIS — M7918 Myalgia, other site: Secondary | ICD-10-CM | POA: Diagnosis not present

## 2018-12-17 DIAGNOSIS — M9904 Segmental and somatic dysfunction of sacral region: Secondary | ICD-10-CM | POA: Diagnosis not present

## 2018-12-17 DIAGNOSIS — M9903 Segmental and somatic dysfunction of lumbar region: Secondary | ICD-10-CM | POA: Diagnosis not present

## 2018-12-17 DIAGNOSIS — M9905 Segmental and somatic dysfunction of pelvic region: Secondary | ICD-10-CM | POA: Diagnosis not present

## 2018-12-22 ENCOUNTER — Other Ambulatory Visit: Payer: Self-pay | Admitting: Family Medicine

## 2018-12-24 ENCOUNTER — Encounter: Payer: Self-pay | Admitting: Gastroenterology

## 2018-12-24 ENCOUNTER — Ambulatory Visit (AMBULATORY_SURGERY_CENTER): Payer: Self-pay

## 2018-12-24 ENCOUNTER — Other Ambulatory Visit: Payer: Self-pay

## 2018-12-24 VITALS — Temp 96.8°F | Ht 68.0 in | Wt 208.0 lb

## 2018-12-24 DIAGNOSIS — Z1211 Encounter for screening for malignant neoplasm of colon: Secondary | ICD-10-CM

## 2018-12-24 MED ORDER — NA SULFATE-K SULFATE-MG SULF 17.5-3.13-1.6 GM/177ML PO SOLN: 1.0000 | Freq: Once | ORAL | 0 refills | Status: AC

## 2018-12-24 NOTE — Progress Notes (Signed)
Denies allergies to eggs or soy products. Denies complication of anesthesia or sedation. Denies use of weight loss medication. Denies use of O2.   Emmi instructions given for colonoscopy.  Covid screening is scheduled for Monday 12/30/18 @ 11:40 Am. A  15.00 coupon for Suprep was given to the patient.

## 2018-12-25 ENCOUNTER — Other Ambulatory Visit (INDEPENDENT_AMBULATORY_CARE_PROVIDER_SITE_OTHER): Payer: BC Managed Care – PPO

## 2018-12-25 ENCOUNTER — Other Ambulatory Visit: Payer: Self-pay

## 2018-12-25 DIAGNOSIS — R972 Elevated prostate specific antigen [PSA]: Secondary | ICD-10-CM

## 2018-12-25 DIAGNOSIS — E7849 Other hyperlipidemia: Secondary | ICD-10-CM | POA: Diagnosis not present

## 2018-12-25 LAB — LDL CHOLESTEROL, DIRECT: Direct LDL: 86 mg/dL

## 2018-12-25 LAB — HEPATIC FUNCTION PANEL
ALT: 17 U/L (ref 0–53)
AST: 15 U/L (ref 0–37)
Albumin: 4.6 g/dL (ref 3.5–5.2)
Alkaline Phosphatase: 64 U/L (ref 39–117)
Bilirubin, Direct: 0.1 mg/dL (ref 0.0–0.3)
Total Bilirubin: 0.6 mg/dL (ref 0.2–1.2)
Total Protein: 7.1 g/dL (ref 6.0–8.3)

## 2018-12-25 LAB — LIPID PANEL
Cholesterol: 147 mg/dL (ref 0–200)
HDL: 35 mg/dL — ABNORMAL LOW (ref >39.00)
NonHDL: 111.97
Total CHOL/HDL Ratio: 4
Triglycerides: 215 mg/dL — ABNORMAL HIGH (ref 0.0–149.0)
VLDL: 43 mg/dL — ABNORMAL HIGH (ref 0.0–40.0)

## 2018-12-25 LAB — PSA: PSA: 2.53 ng/mL (ref 0.10–4.00)

## 2018-12-30 ENCOUNTER — Other Ambulatory Visit: Payer: Self-pay | Admitting: Family Medicine

## 2018-12-30 DIAGNOSIS — Z1159 Encounter for screening for other viral diseases: Secondary | ICD-10-CM | POA: Diagnosis not present

## 2018-12-31 ENCOUNTER — Telehealth: Payer: Self-pay | Admitting: *Deleted

## 2018-12-31 LAB — SARS CORONAVIRUS 2 (TAT 6-24 HRS): SARS Coronavirus 2: NEGATIVE

## 2018-12-31 NOTE — Telephone Encounter (Signed)
Patient returned call:  Notes recorded by Marin Roberts, RMA on 12/31/2018 at 1:47 PM EST  Attempted to reach pt, no answer. Left vm to call back   Burchette, Alinda Sierras, MD Marin Roberts, RMA   Have him take one half tablet every other day for now.     ------   Notes recorded by Marin Roberts, RMA on 12/30/2018 at 3:59 PM EST  Dr. Elease Hashimoto, I spoke with pt about his result. Pt stated he has not been doing well taking the full dose of lipitor. States when he does it makes him feel achy and sluggish. Pt states he has been taking half the recommended dose. He has not been doing it daily just varies day to day depending on how he feels. Wanted to know if there is any chance that you would lower dose.  ------   Advised medication dosing recommendation per note.

## 2019-01-02 ENCOUNTER — Encounter: Payer: Self-pay | Admitting: Gastroenterology

## 2019-01-02 ENCOUNTER — Other Ambulatory Visit: Payer: Self-pay

## 2019-01-02 ENCOUNTER — Ambulatory Visit (AMBULATORY_SURGERY_CENTER): Payer: BC Managed Care – PPO | Admitting: Gastroenterology

## 2019-01-02 VITALS — BP 131/81 | HR 69 | Temp 98.8°F | Resp 20 | Ht 68.0 in | Wt 208.0 lb

## 2019-01-02 DIAGNOSIS — D123 Benign neoplasm of transverse colon: Secondary | ICD-10-CM

## 2019-01-02 DIAGNOSIS — K5730 Diverticulosis of large intestine without perforation or abscess without bleeding: Secondary | ICD-10-CM

## 2019-01-02 DIAGNOSIS — R195 Other fecal abnormalities: Secondary | ICD-10-CM

## 2019-01-02 DIAGNOSIS — Z1211 Encounter for screening for malignant neoplasm of colon: Secondary | ICD-10-CM | POA: Diagnosis not present

## 2019-01-02 DIAGNOSIS — K529 Noninfective gastroenteritis and colitis, unspecified: Secondary | ICD-10-CM

## 2019-01-02 DIAGNOSIS — K648 Other hemorrhoids: Secondary | ICD-10-CM

## 2019-01-02 MED ORDER — SODIUM CHLORIDE 0.9 % IV SOLN: 500.0000 mL | Freq: Once | INTRAVENOUS | Status: DC

## 2019-01-02 NOTE — Patient Instructions (Signed)
HANDOUTS PROVIDED ON: POLYPS, DIVERTICULOSIS, & HEMORRHOIDS   THE POLYPS TAKEN TODAY HAVE BEEN SENT FOR PATHOLOGY.  THE RESULTS CAN TAKE 2-3 WEEKS TO RECEIVE.  BASED ON THE RESULTS IS WHEN YOUR NEXT COLONOSCOPY WILL BE RECOMMENDED.  YOU MAY RESUME YOUR PREVIOUS DIET AND MEDICATION SCHEDULE.  [place] YOU FOR ALLOWING [place] TO CARE FOR YOU TODAY!!!  YOU HAD AN ENDOSCOPIC PROCEDURE TODAY AT [facility]:   Refer to the procedure report that was given to you for any specific questions about what was found during the examination.  If the procedure report does not answer your questions, please call your gastroenterologist to clarify.  If you requested that your care partner not be given the details of your procedure findings, then the procedure report has been included in a sealed envelope for you to review at your convenience later.  YOU SHOULD EXPECT: Some feelings of bloating in the abdomen. Passage of more gas than usual.  Walking can help get rid of the air that was put into your GI tract during the procedure and reduce the bloating. If you had a lower endoscopy (such as a colonoscopy or flexible sigmoidoscopy) you may notice spotting of blood in your stool or on the toilet paper. If you underwent a bowel prep for your procedure, you may not have a normal bowel movement for a few days.  Please Note:  You might notice some irritation and congestion in your nose or some drainage.  This is from the oxygen used during your procedure.  There is no need for concern and it should clear up in a day or so.  SYMPTOMS TO REPORT IMMEDIATELY:   Following lower endoscopy (colonoscopy or flexible sigmoidoscopy):  Excessive amounts of blood in the stool  Significant tenderness or worsening of abdominal pains  Swelling of the abdomen that is new, acute  Fever of 100F or higher  For urgent or emergent issues, a gastroenterologist can be reached at any hour by calling [phone].   DIET:  We  do recommend a small meal at first, but then you may proceed to your regular diet.  Drink plenty of fluids but you should avoid alcoholic beverages for 24 hours.  ACTIVITY:  You should plan to take it easy for the rest of today and you should NOT DRIVE or use heavy machinery until tomorrow (because of the sedation medicines used during the test).    FOLLOW UP: Our staff will call the number listed on your records 48-72 hours following your procedure to check on you and address any questions or concerns that you may have regarding the information given to you following your procedure. If we do not reach you, we will leave a message.  We will attempt to reach you two times.  During this call, we will ask if you have developed any symptoms of COVID 19. If you develop any symptoms (ie: fever, flu-like symptoms, shortness of breath, cough etc.) before then, please call [phone].  If you test positive for Covid 19 in the 2 weeks post procedure, please call and report this information to [place].    If any biopsies were taken you will be contacted by phone or by letter within the next 1-3 weeks.  Please call us at [phone] if you have not heard about the biopsies in 3 weeks.    SIGNATURES/CONFIDENTIALITY: You and/or your care partner have signed paperwork which will be entered into your electronic medical record.  These signatures attest to  the fact that that the information above on your After Visit Summary has been reviewed and is understood.  Full responsibility of the confidentiality of this discharge information lies with you and/or your care-partner.

## 2019-01-02 NOTE — Progress Notes (Signed)
JB- temp CW- Vitals 

## 2019-01-02 NOTE — Progress Notes (Signed)
Pt's states no medical or surgical changes since previsit or office visit. 

## 2019-01-02 NOTE — Op Note (Signed)
[place] Patient Name: Tyrone Jefferson Procedure Date: 01/02/2019 8:40 AM MRN: [id number] Endoscopist: Remo Lipps P. Havery Moros , MD Age: 63 Referring MD:  Date of Birth: 1955/11/21 Gender: Male Account #: [id number] Procedure:                Colonoscopy Indications:              Positive Cologuard test Medicines:                Monitored Anesthesia Care Procedure:                Pre-Anesthesia Assessment:                           - Prior to the procedure, a History and Physical                            was performed, and patient medications and                            allergies were reviewed. The patient's tolerance of                            previous anesthesia was also reviewed. The risks                            and benefits of the procedure and the sedation                            options and risks were discussed with the patient.                            All questions were answered, and informed consent                            was obtained. Prior Anticoagulants: The patient has                            taken no previous anticoagulant or antiplatelet                            agents. ASA Grade Assessment: II - A patient with                            mild systemic disease. After reviewing the risks                            and benefits, the patient was deemed in                            satisfactory condition to undergo the procedure.                           After obtaining informed consent, the colonoscope  was passed under direct vision. Throughout the                            procedure, the patient's blood pressure, pulse, and                            oxygen saturations were monitored continuously. The                            Colonoscope was introduced through the anus and                            advanced to the the terminal ileum, with                            identification of the appendiceal  orifice and IC                            valve. The colonoscopy was performed without                            difficulty. The patient tolerated the procedure                            well. The quality of the bowel preparation was                            good. The terminal ileum, ileocecal valve,                            appendiceal orifice, and rectum were photographed. Scope In: 8:56:01 AM Scope Out: 9:19:04 AM Scope Withdrawal Time: 0 hours 19 minutes 43 seconds  Total Procedure Duration: 0 hours 23 minutes 3 seconds  Findings:                 The perianal and digital rectal examinations were                            normal.                           The terminal ileum contained a few scattered                            aphthae. Biopsies were taken with a cold forceps                            for histology.                           There was a small lipoma, in the cecum.                           Four sessile polyps were found in the transverse  colon. The polyps were diminutive in size. These                            polyps were removed with a cold biopsy forceps.                            Resection and retrieval were complete.                           Multiple small-mouthed diverticula were found in                            the sigmoid colon.                           Internal hemorrhoids were found during retroflexion.                           The exam was otherwise without abnormality. Complications:            No immediate complications. Estimated blood loss:                            Minimal. Estimated Blood Loss:     Estimated blood loss was minimal. Impression:               - A few scattered aphtha in the terminal ileum.                            Biopsied.                           - Small lipoma in the cecum.                           - Four diminutive polyps in the transverse colon,                            removed with  a cold biopsy forceps. Resected and                            retrieved.                           - Diverticulosis in the sigmoid colon.                           - Internal hemorrhoids.                           - The examination was otherwise normal. Recommendation:           - Patient has a contact number available for                            emergencies. The signs and symptoms of potential  delayed complications were discussed with the                            patient. Return to normal activities tomorrow.                            Written discharge instructions were provided to the                            patient.                           - Resume previous diet.                           - Continue present medications.                           - Await pathology results. Remo Lipps P. Taylon Coole, MD 01/02/2019 9:25:05 AM This report has been signed electronically.

## 2019-01-02 NOTE — Progress Notes (Signed)
Called to room to assist during endoscopic procedure.  Patient ID and intended procedure confirmed with present staff. Received instructions for my participation in the procedure from the performing physician.  

## 2019-01-02 NOTE — Progress Notes (Signed)
Report given to PACU, vss 

## 2019-01-06 ENCOUNTER — Telehealth: Payer: Self-pay | Admitting: *Deleted

## 2019-01-06 NOTE — Telephone Encounter (Signed)
  Follow up Call-  Call back number 01/02/2019  Post procedure Call Back phone  # [phone]  Permission to leave phone message Yes  Some recent data might be hidden     Patient questions:  Do you have a fever, pain , or abdominal swelling? No. Pain Score  0 *  Have you tolerated food without any problems? Yes.    Have you been able to return to your normal activities? Yes.    Do you have any questions about your discharge instructions: Diet   No. Medications  No. Follow up visit  No.  Do you have questions or concerns about your Care? No.  Actions: * If pain score is 4 or above: No action needed, pain <4.  1. Have you developed a fever since your procedure? no  2.   Have you had an respiratory symptoms (SOB or cough) since your procedure? no  3.   Have you tested positive for COVID 19 since your procedure no  4.   Have you had any family members/close contacts diagnosed with the COVID 19 since your procedure?  no   If yes to any of these questions please route to Joylene John, RN and Alphonsa Gin, Therapist, sports.

## 2019-01-07 DIAGNOSIS — M9905 Segmental and somatic dysfunction of pelvic region: Secondary | ICD-10-CM | POA: Diagnosis not present

## 2019-01-07 DIAGNOSIS — M9904 Segmental and somatic dysfunction of sacral region: Secondary | ICD-10-CM | POA: Diagnosis not present

## 2019-01-07 DIAGNOSIS — M9903 Segmental and somatic dysfunction of lumbar region: Secondary | ICD-10-CM | POA: Diagnosis not present

## 2019-01-07 DIAGNOSIS — M7918 Myalgia, other site: Secondary | ICD-10-CM | POA: Diagnosis not present

## 2019-02-04 DIAGNOSIS — M9904 Segmental and somatic dysfunction of sacral region: Secondary | ICD-10-CM | POA: Diagnosis not present

## 2019-02-04 DIAGNOSIS — M7918 Myalgia, other site: Secondary | ICD-10-CM | POA: Diagnosis not present

## 2019-02-04 DIAGNOSIS — M9905 Segmental and somatic dysfunction of pelvic region: Secondary | ICD-10-CM | POA: Diagnosis not present

## 2019-02-04 DIAGNOSIS — M9903 Segmental and somatic dysfunction of lumbar region: Secondary | ICD-10-CM | POA: Diagnosis not present

## 2019-02-07 ENCOUNTER — Other Ambulatory Visit: Payer: Self-pay

## 2019-02-10 ENCOUNTER — Other Ambulatory Visit: Payer: Self-pay

## 2019-02-10 ENCOUNTER — Encounter: Payer: Self-pay | Admitting: Family Medicine

## 2019-02-10 ENCOUNTER — Ambulatory Visit (INDEPENDENT_AMBULATORY_CARE_PROVIDER_SITE_OTHER): Payer: BC Managed Care – PPO | Admitting: Family Medicine

## 2019-02-10 VITALS — BP 150/78 | HR 95 | Temp 98.4°F | Ht 68.0 in | Wt 210.4 lb

## 2019-02-10 DIAGNOSIS — I10 Essential (primary) hypertension: Secondary | ICD-10-CM

## 2019-02-10 MED ORDER — LISINOPRIL 40 MG PO TABS: 40.0000 mg | ORAL_TABLET | Freq: Every day | ORAL | 11 refills | Status: DC

## 2019-02-10 NOTE — Progress Notes (Signed)
  Subjective:     Patient ID: Tyrone Jefferson, male   DOB: [birth year], 63 y.o.   MRN: [id number]  HPI Tyrone Jefferson seen for follow-up hypertension.  He was seen back in September and we had recommended a couple of months of lifestyle modification.  He is unfortunately not lost any weight or done any consistent exercise since then.  Also not taking his HCTZ consistently.  He does remain on amlodipine and lisinopril.  No recent headaches, dizziness, or chest pains.  No regular alcohol use.  Strong family history of hypertension in parents  Re: HCTZ.  He states he was not taking this regularly because of increased urination.  He is encouraged to try to take half dose of 12.5 mg daily  Past Medical History:  Diagnosis Date  . Chest pain   . Dyspnea   . Hyperlipidemia   . Hypertension   . Metabolic syndrome   . OSA (obstructive sleep apnea)   . Sleep apnea    Past Surgical History:  Procedure Laterality Date  . BACK SURGERY  1992  . SPINE SURGERY  1992   L4-5 discectomy    reports that he has never smoked. He has never used smokeless tobacco. He reports that he does not drink alcohol or use drugs. family history includes Brain cancer in his mother; Colon cancer in his father; Diabetes in his mother; Heart attack (age of onset: 59) in his mother; Heart disease (age of onset: 11) in his father; Hypertension in his mother. No Known Allergies   Review of Systems  Constitutional: Negative for chills, fatigue, fever and unexpected weight change.  Respiratory: Negative for shortness of breath.   Cardiovascular: Negative for chest pain.  Neurological: Negative for dizziness and headaches.       Objective:   Physical Exam Vitals reviewed.  Constitutional:      Appearance: Normal appearance.  Cardiovascular:     Rate and Rhythm: Normal rate and regular rhythm.  Pulmonary:     Effort: Pulmonary effort is normal.     Breath sounds: Normal breath sounds.  Musculoskeletal:     Cervical back:  Neck supple.     Right lower leg: No edema.     Left lower leg: No edema.  Neurological:     Mental Status: He is alert.        Assessment:     Hypertension.  Poorly controlled.  Repeat blood pressure left arm seated after rest 150/78    Plan:     -Discussed nonpharmacologic management and of recommended weight loss, establish more consistent aerobic exercise, restrict sodium intake to 2000 mg daily  -Increase lisinopril to 40 mg daily  -Get back on consistent use of HCTZ 12.5 mg daily  -Office follow-up in 2 months to reassess   [name] MD [facility] at [facility]

## 2019-02-10 NOTE — Patient Instructions (Signed)
Get back on the HCTZ at one half tablet once daily  Increase the Lisinopril to 40 mg daily (new Rx sent)  Step up the exercise.

## 2019-02-23 ENCOUNTER — Other Ambulatory Visit: Payer: Self-pay | Admitting: Family Medicine

## 2019-03-27 IMAGING — DX DG KNEE AP/LAT W/ SUNRISE*R*
3 series · 3 of 3 positions shown · non-contrast
Comparison: None.

CLINICAL DATA: Right knee pain since [REDACTED].  No injury.

EXAM:
RIGHT KNEE 3 VIEWS

[knee standing ap]
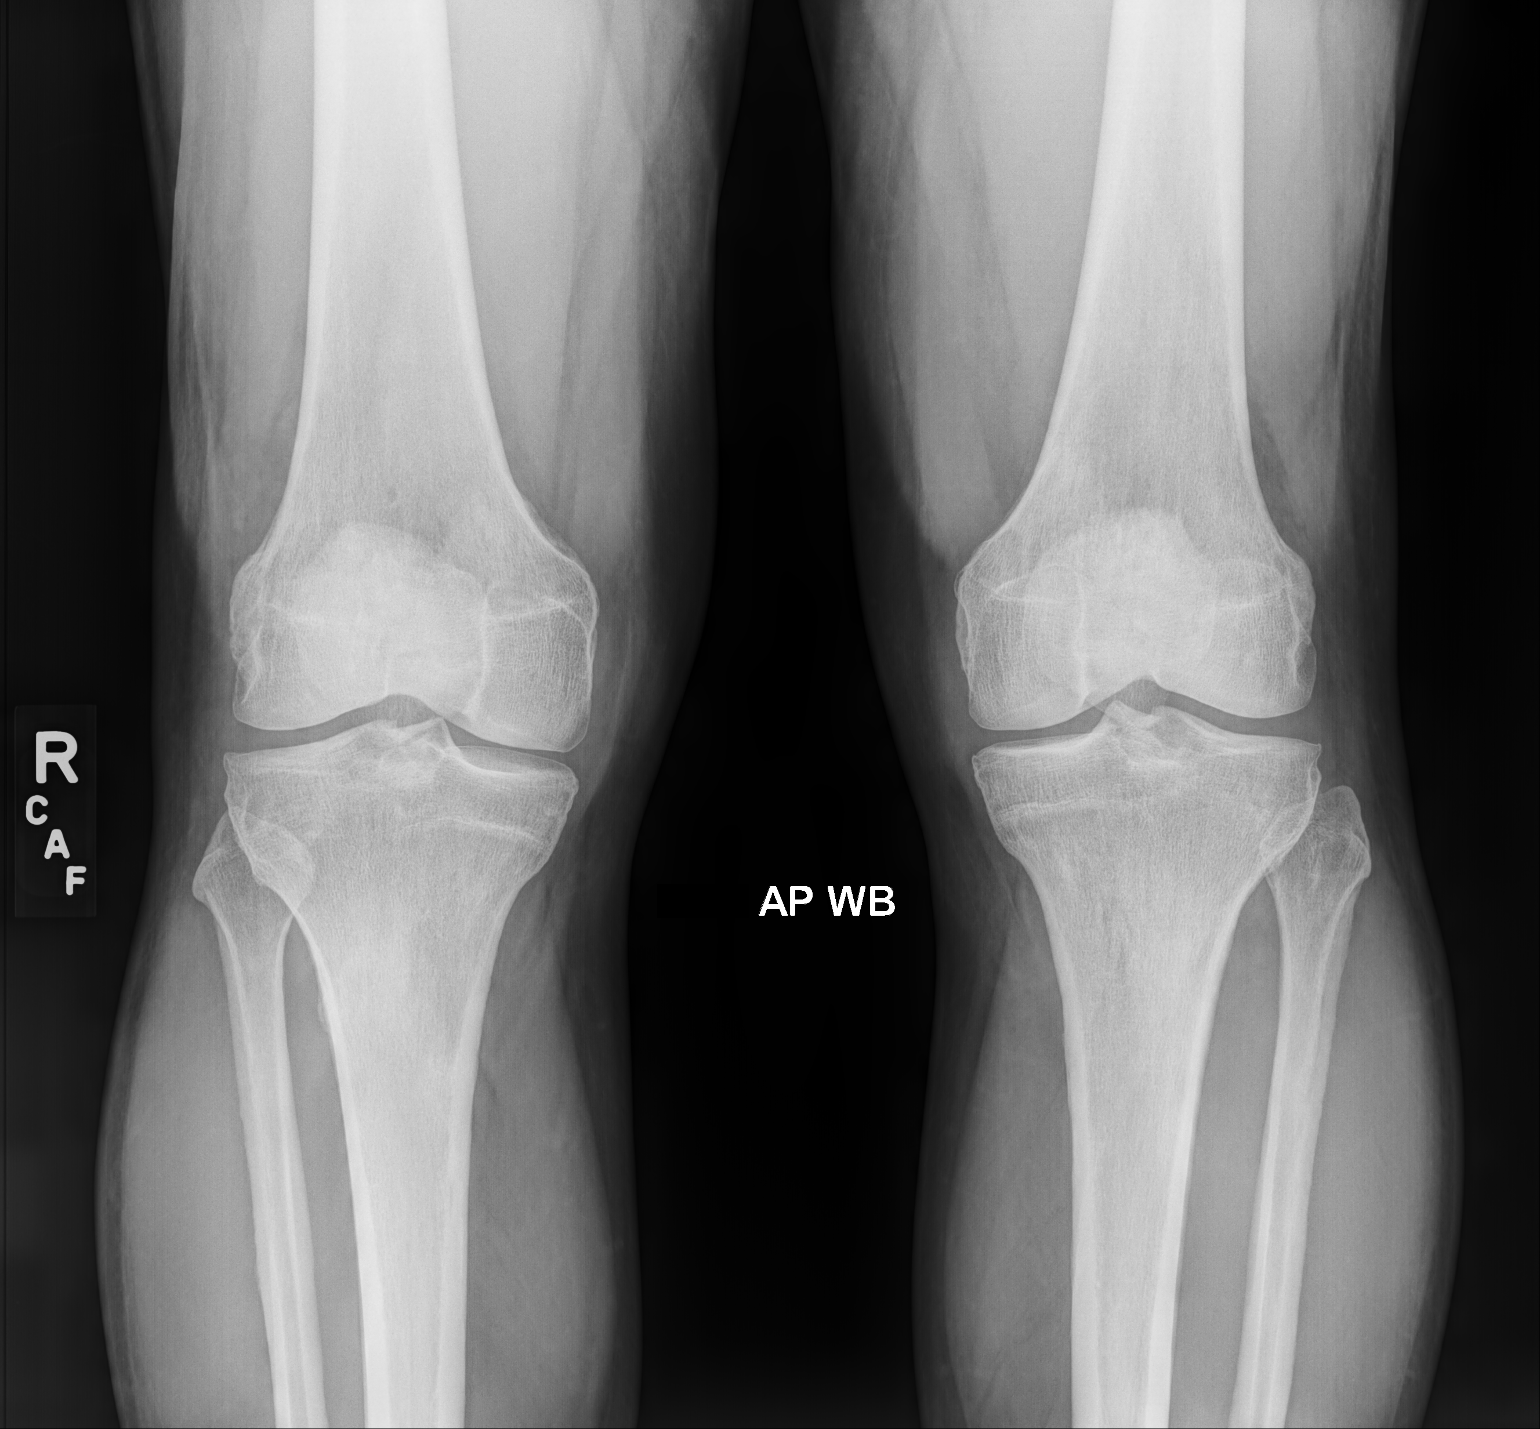

[knee standing lat]
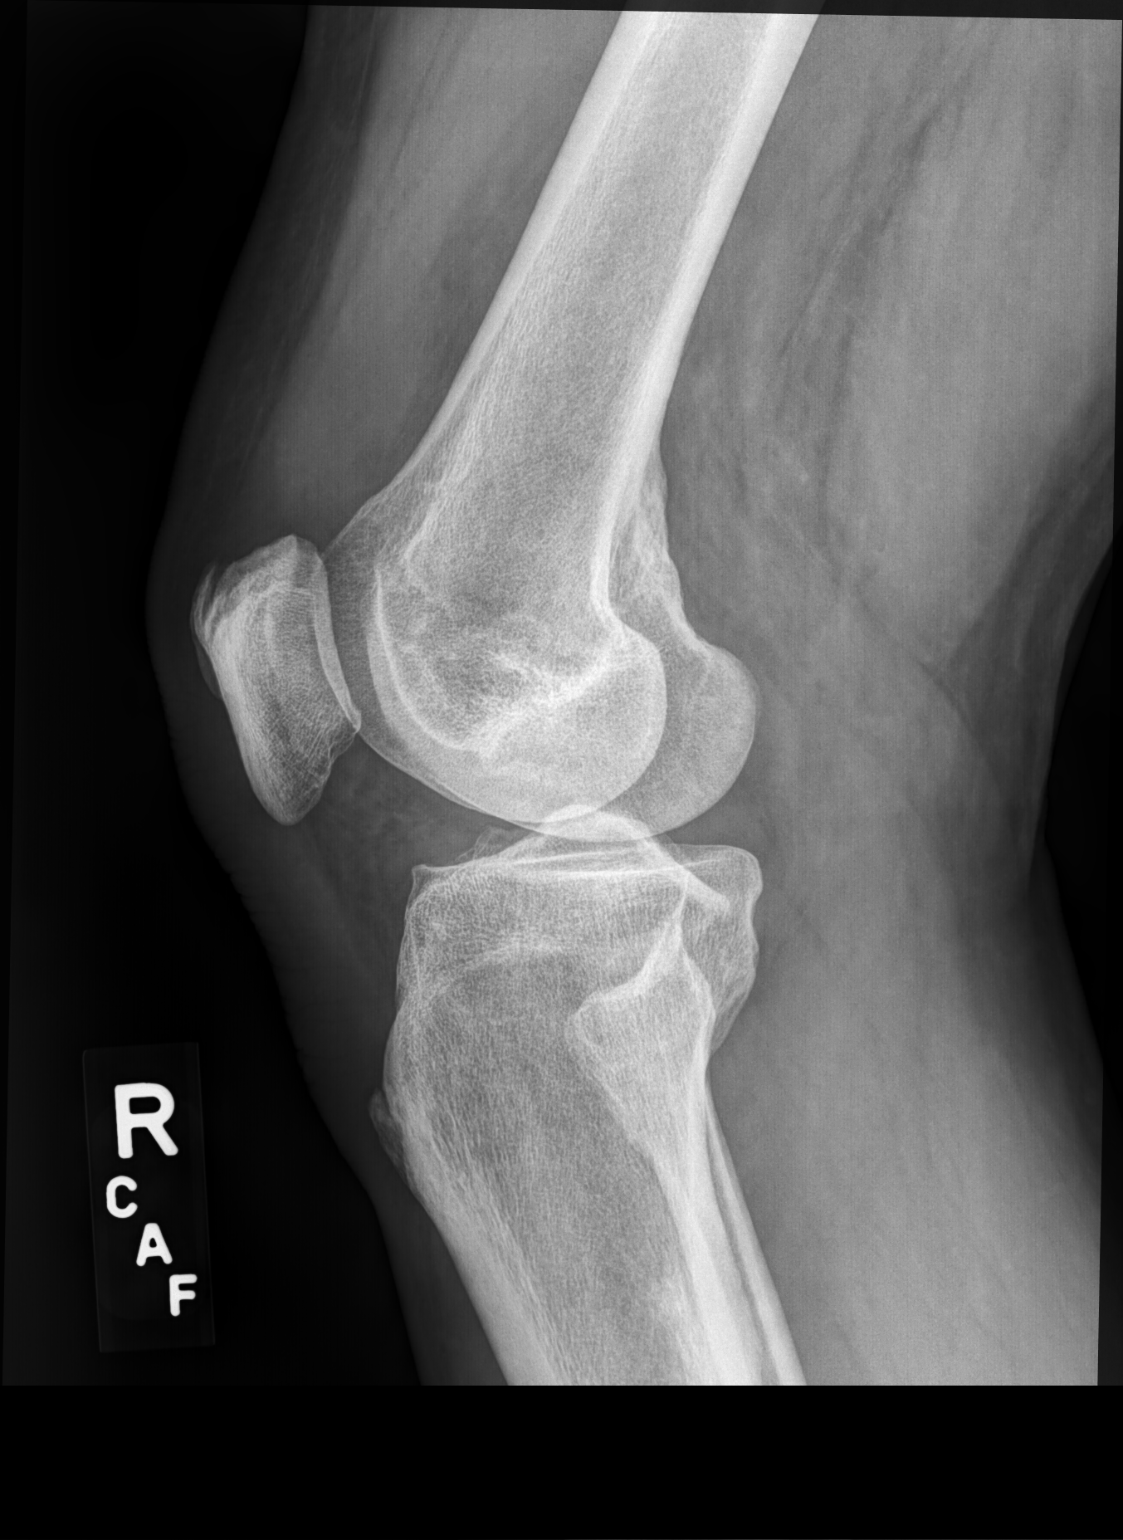

[sunrise]
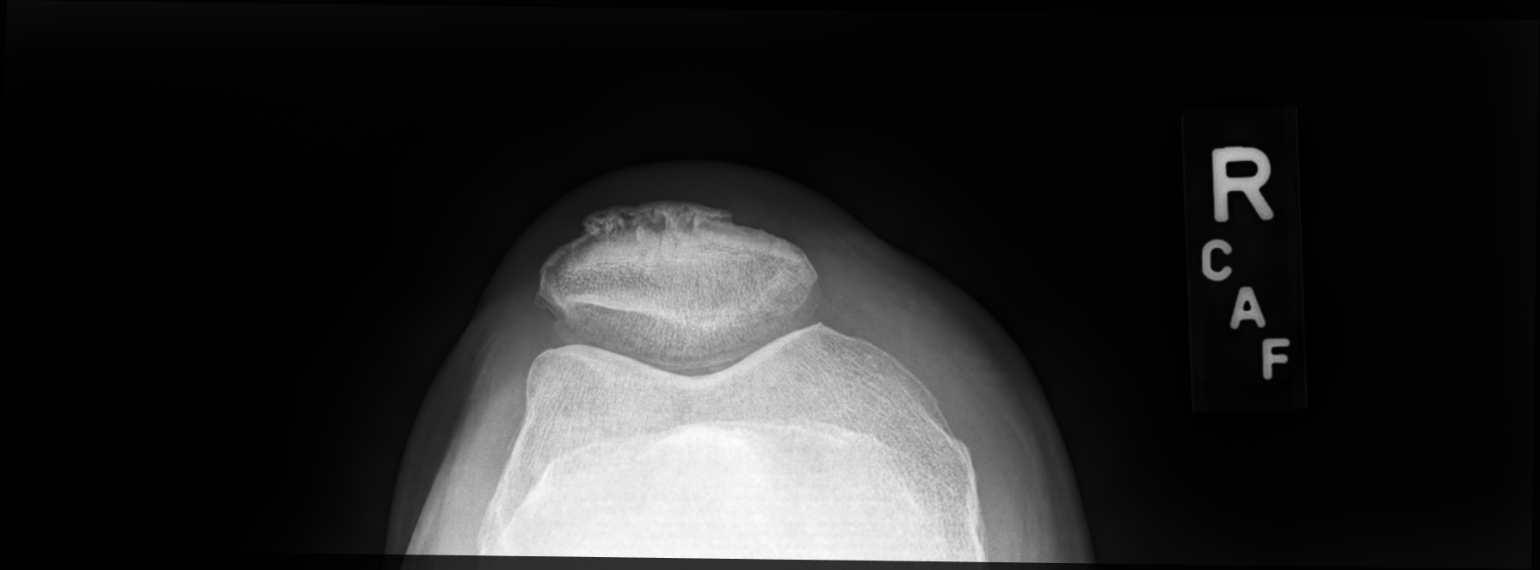

[3 of 3 positions shown; findings below may reference images not displayed]

FINDINGS: No evidence of fracture, dislocation. Suprapatellar effusion is
identified. Mild narrowed medial femoral tibial joint spaces are
identified bilaterally.
IMPRESSION: Right suprapatellar effusion noted. Mild degenerative joint changes
of bilateral knees.

## 2019-04-11 ENCOUNTER — Other Ambulatory Visit: Payer: Self-pay

## 2019-04-14 ENCOUNTER — Ambulatory Visit (INDEPENDENT_AMBULATORY_CARE_PROVIDER_SITE_OTHER): Payer: BC Managed Care – PPO | Admitting: Family Medicine

## 2019-04-14 ENCOUNTER — Other Ambulatory Visit: Payer: Self-pay

## 2019-04-14 ENCOUNTER — Encounter: Payer: Self-pay | Admitting: Family Medicine

## 2019-04-14 VITALS — BP 142/78 | HR 87 | Temp 97.5°F | Ht 68.0 in | Wt 206.0 lb

## 2019-04-14 DIAGNOSIS — I10 Essential (primary) hypertension: Secondary | ICD-10-CM

## 2019-04-14 NOTE — Patient Instructions (Signed)

## 2019-04-14 NOTE — Progress Notes (Signed)
  Subjective:     Patient ID: Tyrone Jefferson, male   DOB: 1955/12/15, 64 y.o.   MRN: [id number]  HPI Tyrone Jefferson is seen for follow-up hypertension.  Refer to prior note for details.  We increased his lisinopril to 40 mg daily.  He also remains on HCTZ 12.5 mg daily and amlodipine 5 mg daily.  He has gained some weight since he retired.  He is currently not exercising regularly.  Tries to watch sodium intake.  Home blood pressures have been around [id number] systolic and 123XX123 diastolic.  No recent headaches, dizziness, or chest pain.  Past Medical History:  Diagnosis Date  . Chest pain   . Dyspnea   . Hyperlipidemia   . Hypertension   . Metabolic syndrome   . OSA (obstructive sleep apnea)   . Sleep apnea    Past Surgical History:  Procedure Laterality Date  . BACK SURGERY  1992  . SPINE SURGERY  1992   L4-5 discectomy    reports that he has never smoked. He has never used smokeless tobacco. He reports that he does not drink alcohol or use drugs. family history includes Brain cancer in his mother; Colon cancer in his father; Diabetes in his mother; Heart attack (age of onset: 28) in his mother; Heart disease (age of onset: 72) in his father; Hypertension in his mother. No Known Allergies  Wt Readings from Last 3 Encounters:  04/14/19 206 lb (93.4 kg)  02/10/19 210 lb 6.4 oz (95.4 kg)  01/02/19 208 lb (94.3 kg)     Review of Systems  Constitutional: Negative for fatigue.  Eyes: Negative for visual disturbance.  Respiratory: Negative for cough, chest tightness and shortness of breath.   Cardiovascular: Negative for chest pain, palpitations and leg swelling.  Neurological: Negative for dizziness, syncope, weakness, light-headedness and headaches.       Objective:   Physical Exam Vitals reviewed.  Constitutional:      Appearance: He is well-developed.  HENT:     Right Ear: External ear normal.     Left Ear: External ear normal.  Eyes:     Pupils: Pupils are equal, round, and  reactive to light.  Neck:     Thyroid: No thyromegaly.  Cardiovascular:     Rate and Rhythm: Normal rate and regular rhythm.  Pulmonary:     Effort: Pulmonary effort is normal. No respiratory distress.     Breath sounds: Normal breath sounds. No wheezing or rales.  Musculoskeletal:     Cervical back: Neck supple.  Neurological:     Mental Status: He is alert and oriented to person, place, and time.        Assessment:     Hypertension.  Improved but not quite to goal.  We would like to see this closer to 130/80.    Plan:     -We discussed options.  Patient would like to have 2-month trial of weight loss and establishing more consistent aerobic exercise.  If not below [id number] systolic at that point would titrate amlodipine to 10 mg daily.  Tyrone Post MD [facility] at [facility]

## 2019-04-20 ENCOUNTER — Other Ambulatory Visit: Payer: Self-pay | Admitting: Family Medicine

## 2019-04-23 ENCOUNTER — Other Ambulatory Visit: Payer: Self-pay | Admitting: Family Medicine

## 2019-05-12 ENCOUNTER — Other Ambulatory Visit: Payer: Self-pay

## 2019-05-13 ENCOUNTER — Other Ambulatory Visit: Payer: Self-pay | Admitting: Family Medicine

## 2019-05-13 ENCOUNTER — Other Ambulatory Visit (INDEPENDENT_AMBULATORY_CARE_PROVIDER_SITE_OTHER): Payer: BC Managed Care – PPO

## 2019-05-13 DIAGNOSIS — E785 Hyperlipidemia, unspecified: Secondary | ICD-10-CM

## 2019-05-13 DIAGNOSIS — R972 Elevated prostate specific antigen [PSA]: Secondary | ICD-10-CM

## 2019-05-13 LAB — LIPID PANEL
Cholesterol: 152 mg/dL (ref 0–200)
HDL: 33.9 mg/dL — ABNORMAL LOW (ref >39.00)
NonHDL: 118.37
Total CHOL/HDL Ratio: 4
Triglycerides: 230 mg/dL — ABNORMAL HIGH (ref 0.0–149.0)
VLDL: 46 mg/dL — ABNORMAL HIGH (ref 0.0–40.0)

## 2019-05-13 LAB — PSA: PSA: 3.16 ng/mL (ref 0.10–4.00)

## 2019-05-13 LAB — LDL CHOLESTEROL, DIRECT: Direct LDL: 90 mg/dL

## 2019-05-14 ENCOUNTER — Other Ambulatory Visit: Payer: Self-pay

## 2019-05-14 ENCOUNTER — Telehealth: Payer: Self-pay

## 2019-05-14 DIAGNOSIS — R972 Elevated prostate specific antigen [PSA]: Secondary | ICD-10-CM

## 2019-05-14 NOTE — Telephone Encounter (Signed)
Spoke with pt he saw results and will call back to schedule lab appt

## 2019-06-22 ENCOUNTER — Other Ambulatory Visit: Payer: Self-pay | Admitting: Family Medicine

## 2019-07-20 ENCOUNTER — Other Ambulatory Visit: Payer: Self-pay | Admitting: Family Medicine

## 2019-08-19 ENCOUNTER — Other Ambulatory Visit: Payer: Self-pay | Admitting: Family Medicine

## 2019-10-13 ENCOUNTER — Encounter: Payer: Self-pay | Admitting: Family Medicine

## 2019-10-13 ENCOUNTER — Other Ambulatory Visit: Payer: Self-pay

## 2019-10-13 ENCOUNTER — Ambulatory Visit (INDEPENDENT_AMBULATORY_CARE_PROVIDER_SITE_OTHER): Payer: BC Managed Care – PPO | Admitting: Family Medicine

## 2019-10-13 VITALS — BP 150/80 | HR 84 | Temp 98.3°F | Ht 68.0 in | Wt 210.3 lb

## 2019-10-13 DIAGNOSIS — I10 Essential (primary) hypertension: Secondary | ICD-10-CM | POA: Diagnosis not present

## 2019-10-13 MED ORDER — AMLODIPINE BESYLATE 10 MG PO TABS: 10.0000 mg | ORAL_TABLET | Freq: Every day | ORAL | 11 refills | Status: DC

## 2019-10-13 NOTE — Patient Instructions (Signed)
Increase the Amlodipine to 10 mg daily  Lose some weight  Let's plan on 3 month follow up.

## 2019-10-13 NOTE — Progress Notes (Signed)
Established Patient Office Visit  Subjective:  Patient ID: Tyrone Jefferson, male    DOB: 09/20/1955  Age: 64 y.o. MRN: [id number]  CC:  Chief Complaint  Patient presents with  . Follow-up    HPI Tyrone Jefferson presents for follow-up hypertension.  He takes amlodipine 5 mg daily, lisinopril 40 mg daily, HCTZ 25 mg daily.  He states he is compliant with therapy.  He has gained a few pounds of weight and is not exercising regularly.  He knows he needs to lose some weight.  He and his wife are looking at possibly getting a treadmill.  He does not drink any regular alcohol.  We had suggested at his physical 6 months ago that consider titration of amlodipine if blood pressure not further to goal at this time.  Past Medical History:  Diagnosis Date  . Chest pain   . Dyspnea   . Hyperlipidemia   . Hypertension   . Metabolic syndrome   . OSA (obstructive sleep apnea)   . Sleep apnea     Past Surgical History:  Procedure Laterality Date  . BACK SURGERY  1992  . SPINE SURGERY  1992   L4-5 discectomy    Family History  Problem Relation Age of Onset  . Heart attack Mother 12  . Hypertension Mother   . Diabetes Mother   . Brain cancer Mother   . Heart disease Father 86  . Colon cancer Father   . Rectal cancer Neg Hx   . Stomach cancer Neg Hx     Social History   Socioeconomic History  . Marital status: Married    Spouse name: Pamala Hurry  . Number of children: N  . Years of education: Not on file  . Highest education level: Not on file  Occupational History  . Occupation: route sales  Tobacco Use  . Smoking status: Never Smoker  . Smokeless tobacco: Never Used  Vaping Use  . Vaping Use: Never used  Substance and Sexual Activity  . Alcohol use: No  . Drug use: No  . Sexual activity: Not on file  Other Topics Concern  . Not on file  Social History Narrative  . Not on file   Social Determinants of Health   Financial Resource Strain:   . Difficulty of Paying  Living Expenses: Not on file  Food Insecurity:   . Worried About Charity fundraiser in the Last Year: Not on file  . Ran Out of Food in the Last Year: Not on file  Transportation Needs:   . Lack of Transportation (Medical): Not on file  . Lack of Transportation (Non-Medical): Not on file  Physical Activity:   . Days of Exercise per Week: Not on file  . Minutes of Exercise per Session: Not on file  Stress:   . Feeling of Stress : Not on file  Social Connections:   . Frequency of Communication with Friends and Family: Not on file  . Frequency of Social Gatherings with Friends and Family: Not on file  . Attends Religious Services: Not on file  . Active Member of Clubs or Organizations: Not on file  . Attends Archivist Meetings: Not on file  . Marital Status: Not on file  Intimate Partner Violence:   . Fear of Current or Ex-Partner: Not on file  . Emotionally Abused: Not on file  . Physically Abused: Not on file  . Sexually Abused: Not on file    Outpatient Medications Prior to  Visit  Medication Sig Dispense Refill  . atorvastatin (LIPITOR) 20 MG tablet Take 1 tablet (20 mg total) by mouth at bedtime. 90 tablet 3  . hydrochlorothiazide (HYDRODIURIL) 25 MG tablet TAKE 1 TABLET BY MOUTH EVERY DAY 30 tablet 2  . ibuprofen (ADVIL) 100 MG/5ML suspension Take 200 mg by mouth every 4 (four) hours as needed.    Marland Kitchen lisinopril (ZESTRIL) 40 MG tablet Take 1 tablet (40 mg total) by mouth daily. 30 tablet 11  . MAGNESIUM ASPARTATE PO Take 0.5 tablets by mouth daily.    . Multiple Vitamin (MULTIVITAMIN) tablet Take 1 tablet by mouth daily.    Marland Kitchen OVER THE COUNTER MEDICATION Co Q 10, 100 mg, daily.    Marland Kitchen amLODipine (NORVASC) 5 MG tablet TAKE 1 TABLET BY MOUTH EVERY DAY 30 tablet 1   No facility-administered medications prior to visit.    No Known Allergies  ROS Review of Systems  Constitutional: Negative for fatigue.  Eyes: Negative for visual disturbance.  Respiratory: Negative  for cough, chest tightness and shortness of breath.   Cardiovascular: Negative for chest pain, palpitations and leg swelling.  Endocrine: Negative for polydipsia and polyuria.  Neurological: Negative for dizziness, syncope, weakness, light-headedness and headaches.      Objective:    Physical Exam Vitals reviewed.  Constitutional:      Appearance: Normal appearance.  Cardiovascular:     Rate and Rhythm: Normal rate and regular rhythm.  Pulmonary:     Effort: Pulmonary effort is normal.     Breath sounds: Normal breath sounds.  Neurological:     Mental Status: He is alert.     BP (!) 150/80 (BP Location: Left Arm, Patient Position: Sitting, Cuff Size: Large)   Pulse 84   Temp 98.3 F (36.8 C) (Oral)   Ht 5\' 8"  (1.727 m)   Wt 210 lb 4.8 oz (95.4 kg)   BMI 31.98 kg/m  Wt Readings from Last 3 Encounters:  10/13/19 210 lb 4.8 oz (95.4 kg)  04/14/19 206 lb (93.4 kg)  02/10/19 210 lb 6.4 oz (95.4 kg)     Health Maintenance Due  Topic Date Due  . HIV Screening  Never done  . INFLUENZA VACCINE  09/21/2019    There are no preventive care reminders to display for this patient.  Lab Results  Component Value Date   TSH 3.27 11/11/2018   Lab Results  Component Value Date   WBC 8.3 11/11/2018   HGB 15.7 11/11/2018   HCT 44.6 11/11/2018   MCV 85.2 11/11/2018   PLT 282.0 11/11/2018   Lab Results  Component Value Date   NA 139 11/11/2018   K 3.3 (L) 11/11/2018   CO2 35 (H) 11/11/2018   GLUCOSE 103 (H) 11/11/2018   BUN 16 11/11/2018   CREATININE 0.85 11/11/2018   BILITOT 0.6 12/25/2018   ALKPHOS 64 12/25/2018   AST 15 12/25/2018   ALT 17 12/25/2018   PROT 7.1 12/25/2018   ALBUMIN 4.6 12/25/2018   CALCIUM 9.5 11/11/2018   GFR 91.16 11/11/2018   Lab Results  Component Value Date   CHOL 152 05/13/2019   Lab Results  Component Value Date   HDL 33.90 (L) 05/13/2019   Lab Results  Component Value Date   LDLCALC 116 (H) 08/07/2014   Lab Results  Component  Value Date   TRIG 230.0 (H) 05/13/2019   Lab Results  Component Value Date   CHOLHDL 4 05/13/2019   No results found for: HGBA1C  Assessment & Plan:   Problem List Items Addressed This Visit      Unprioritized   Hypertension - Primary   Relevant Medications   amLODipine (NORVASC) 10 MG tablet    Hypertension suboptimally controlled.  We strongly advise weight loss and more consistent exercise.  We will go ahead and titrate amlodipine to 10 mg daily and bring back for office follow-up in 3 months to reassess  Meds ordered this encounter  Medications  . amLODipine (NORVASC) 10 MG tablet    Sig: Take 1 tablet (10 mg total) by mouth daily.    Dispense:  30 tablet    Refill:  11    Follow-up: Return in about 3 months (around 01/13/2020).    Carolann Littler, MD

## 2019-10-18 ENCOUNTER — Other Ambulatory Visit: Payer: Self-pay | Admitting: Family Medicine

## 2020-01-13 ENCOUNTER — Other Ambulatory Visit: Payer: Self-pay

## 2020-01-13 ENCOUNTER — Ambulatory Visit (INDEPENDENT_AMBULATORY_CARE_PROVIDER_SITE_OTHER): Payer: BC Managed Care – PPO | Admitting: Family Medicine

## 2020-01-13 ENCOUNTER — Encounter: Payer: Self-pay | Admitting: Family Medicine

## 2020-01-13 VITALS — BP 142/80 | HR 74 | Temp 98.0°F | Wt 207.0 lb

## 2020-01-13 DIAGNOSIS — I10 Essential (primary) hypertension: Secondary | ICD-10-CM

## 2020-01-13 DIAGNOSIS — R972 Elevated prostate specific antigen [PSA]: Secondary | ICD-10-CM | POA: Diagnosis not present

## 2020-01-13 LAB — PSA: PSA: 2.41 ng/mL (ref <=4.0)

## 2020-01-13 NOTE — Progress Notes (Signed)
Established Patient Office Visit  Subjective:  Patient ID: Tyrone Jefferson, male    DOB: August 02, 1955  Age: 64 y.o. MRN: [id number]  CC:  Chief Complaint  Patient presents with  . Hypertension  . psa  . Edema    ankle feet    HPI Tyrone Jefferson presents for follow-up regarding hypertension.  He has been on lisinopril and HCTZ for several years.  We had added amlodipine recently titrated this up to 10 mg.  He has had some mild ankle and lower leg edema which is worse late in the day but this is not severe.  Not monitoring blood pressures regularly at home.  No dizziness.  No headaches.  Tries to watch sodium intake.  No alcohol use.  He had recent physical.  His PSA was 3.16.  Prior PSA been 2.53 but 6 months prior to that 2.91.  Denies any urinary obstructive symptoms.  No burning with urination.  Past Medical History:  Diagnosis Date  . Chest pain   . Dyspnea   . Hyperlipidemia   . Hypertension   . Metabolic syndrome   . OSA (obstructive sleep apnea)   . Sleep apnea     Past Surgical History:  Procedure Laterality Date  . BACK SURGERY  1992  . SPINE SURGERY  1992   L4-5 discectomy    Family History  Problem Relation Age of Onset  . Heart attack Mother 52  . Hypertension Mother   . Diabetes Mother   . Brain cancer Mother   . Heart disease Father 70  . Colon cancer Father   . Rectal cancer Neg Hx   . Stomach cancer Neg Hx     Social History   Socioeconomic History  . Marital status: Married    Spouse name: Pamala Hurry  . Number of children: N  . Years of education: Not on file  . Highest education level: Not on file  Occupational History  . Occupation: route sales  Tobacco Use  . Smoking status: Never Smoker  . Smokeless tobacco: Never Used  Vaping Use  . Vaping Use: Never used  Substance and Sexual Activity  . Alcohol use: No  . Drug use: No  . Sexual activity: Not on file  Other Topics Concern  . Not on file  Social History Narrative  . Not on  file   Social Determinants of Health   Financial Resource Strain:   . Difficulty of Paying Living Expenses: Not on file  Food Insecurity:   . Worried About Charity fundraiser in the Last Year: Not on file  . Ran Out of Food in the Last Year: Not on file  Transportation Needs:   . Lack of Transportation (Medical): Not on file  . Lack of Transportation (Non-Medical): Not on file  Physical Activity:   . Days of Exercise per Week: Not on file  . Minutes of Exercise per Session: Not on file  Stress:   . Feeling of Stress : Not on file  Social Connections:   . Frequency of Communication with Friends and Family: Not on file  . Frequency of Social Gatherings with Friends and Family: Not on file  . Attends Religious Services: Not on file  . Active Member of Clubs or Organizations: Not on file  . Attends Archivist Meetings: Not on file  . Marital Status: Not on file  Intimate Partner Violence:   . Fear of Current or Ex-Partner: Not on file  . Emotionally Abused:  Not on file  . Physically Abused: Not on file  . Sexually Abused: Not on file    Outpatient Medications Prior to Visit  Medication Sig Dispense Refill  . amLODipine (NORVASC) 10 MG tablet Take 1 tablet (10 mg total) by mouth daily. 30 tablet 11  . atorvastatin (LIPITOR) 20 MG tablet Take 1 tablet (20 mg total) by mouth at bedtime. 90 tablet 3  . hydrochlorothiazide (HYDRODIURIL) 25 MG tablet TAKE 1 TABLET BY MOUTH EVERY DAY 30 tablet 2  . ibuprofen (ADVIL) 100 MG/5ML suspension Take 200 mg by mouth every 4 (four) hours as needed.    Marland Kitchen lisinopril (ZESTRIL) 40 MG tablet Take 1 tablet (40 mg total) by mouth daily. 30 tablet 11  . MAGNESIUM ASPARTATE PO Take 0.5 tablets by mouth daily.    . Multiple Vitamin (MULTIVITAMIN) tablet Take 1 tablet by mouth daily.    Marland Kitchen OVER THE COUNTER MEDICATION Co Q 10, 100 mg, daily.    Marland Kitchen amLODipine (NORVASC) 5 MG tablet TAKE 1 TABLET BY MOUTH EVERY DAY 30 tablet 0   No  facility-administered medications prior to visit.    No Known Allergies  ROS Review of Systems  Constitutional: Negative for fatigue.  Eyes: Negative for visual disturbance.  Respiratory: Negative for cough, chest tightness and shortness of breath.   Cardiovascular: Negative for chest pain, palpitations and leg swelling.  Neurological: Negative for dizziness, syncope, weakness, light-headedness and headaches.      Objective:    Physical Exam Constitutional:      Appearance: He is well-developed.  HENT:     Right Ear: External ear normal.     Left Ear: External ear normal.  Eyes:     Pupils: Pupils are equal, round, and reactive to light.  Neck:     Thyroid: No thyromegaly.  Cardiovascular:     Rate and Rhythm: Normal rate and regular rhythm.  Pulmonary:     Effort: Pulmonary effort is normal. No respiratory distress.     Breath sounds: Normal breath sounds. No wheezing or rales.  Musculoskeletal:     Cervical back: Neck supple.     Comments: He has trace nonpitting edema lower legs and ankles bilaterally  Neurological:     Mental Status: He is alert and oriented to person, place, and time.     BP (!) 142/80 (BP Location: Left Arm, Cuff Size: Large)   Pulse 74   Temp 98 F (36.7 C) (Oral)   Wt 207 lb (93.9 kg)   SpO2 93%   BMI 31.47 kg/m  Wt Readings from Last 3 Encounters:  01/13/20 207 lb (93.9 kg)  10/13/19 210 lb 4.8 oz (95.4 kg)  04/14/19 206 lb (93.4 kg)     Health Maintenance Due  Topic Date Due  . HIV Screening  Never done    There are no preventive care reminders to display for this patient.  Lab Results  Component Value Date   TSH 3.27 11/11/2018   Lab Results  Component Value Date   WBC 8.3 11/11/2018   HGB 15.7 11/11/2018   HCT 44.6 11/11/2018   MCV 85.2 11/11/2018   PLT 282.0 11/11/2018   Lab Results  Component Value Date   NA 139 11/11/2018   K 3.3 (L) 11/11/2018   CO2 35 (H) 11/11/2018   GLUCOSE 103 (H) 11/11/2018   BUN 16  11/11/2018   CREATININE 0.85 11/11/2018   BILITOT 0.6 12/25/2018   ALKPHOS 64 12/25/2018   AST 15 12/25/2018  ALT 17 12/25/2018   PROT 7.1 12/25/2018   ALBUMIN 4.6 12/25/2018   CALCIUM 9.5 11/11/2018   GFR 91.16 11/11/2018   Lab Results  Component Value Date   CHOL 152 05/13/2019   Lab Results  Component Value Date   HDL 33.90 (L) 05/13/2019   Lab Results  Component Value Date   LDLCALC 116 (H) 08/07/2014   Lab Results  Component Value Date   TRIG 230.0 (H) 05/13/2019   Lab Results  Component Value Date   CHOLHDL 4 05/13/2019   No results found for: HGBA1C    Assessment & Plan:   #1 hypertension-improved but still not quite to goal.  He is on combination therapy with amlodipine, HCTZ, and lisinopril.  Has lost about 3 pounds since last visit.  He does have some mild edema probably related to amlodipine but not intolerable  -We discussed options of additional medication versus 3 months of continued weight loss and exercise and he prefers the latter. -Recommend 79-month follow-up.  If not further to goal at that point consider low-dose beta-blocker  #2 mild increased PSA velocity from recent labs -Recheck PSA today  No orders of the defined types were placed in this encounter.   Follow-up: Return in about 3 months (around 04/14/2020).    Carolann Littler, MD

## 2020-01-13 NOTE — Addendum Note (Signed)
Addended by: Janann Colonel on: 01/13/2020 10:41 AM   Modules accepted: Orders

## 2020-01-13 NOTE — Patient Instructions (Signed)
BP has improved- but would like to see this down another 10 points or so  Continue to lose some weight!    Exercise regularly.

## 2020-01-18 ENCOUNTER — Other Ambulatory Visit: Payer: Self-pay | Admitting: Family Medicine

## 2020-02-16 DIAGNOSIS — R531 Weakness: Secondary | ICD-10-CM | POA: Diagnosis not present

## 2020-02-16 DIAGNOSIS — M5451 Vertebrogenic low back pain: Secondary | ICD-10-CM | POA: Diagnosis not present

## 2020-02-17 ENCOUNTER — Ambulatory Visit
Admission: RE | Admit: 2020-02-17 | Discharge: 2020-02-17 | Disposition: A | Discharge status: Home / Self Care | Payer: BC Managed Care – PPO | Source: Ambulatory Visit | Attending: Sports Medicine | Admitting: Sports Medicine

## 2020-02-17 ENCOUNTER — Other Ambulatory Visit: Payer: Self-pay | Admitting: Family Medicine

## 2020-02-17 ENCOUNTER — Other Ambulatory Visit: Payer: Self-pay

## 2020-02-17 ENCOUNTER — Other Ambulatory Visit: Payer: Self-pay | Admitting: Sports Medicine

## 2020-02-17 DIAGNOSIS — M545 Low back pain, unspecified: Secondary | ICD-10-CM | POA: Diagnosis not present

## 2020-03-01 DIAGNOSIS — M5451 Vertebrogenic low back pain: Secondary | ICD-10-CM | POA: Diagnosis not present

## 2020-03-01 DIAGNOSIS — M9903 Segmental and somatic dysfunction of lumbar region: Secondary | ICD-10-CM | POA: Diagnosis not present

## 2020-03-01 DIAGNOSIS — M4726 Other spondylosis with radiculopathy, lumbar region: Secondary | ICD-10-CM | POA: Diagnosis not present

## 2020-03-01 DIAGNOSIS — R531 Weakness: Secondary | ICD-10-CM | POA: Diagnosis not present

## 2020-04-13 ENCOUNTER — Other Ambulatory Visit: Payer: Self-pay

## 2020-04-14 ENCOUNTER — Ambulatory Visit (INDEPENDENT_AMBULATORY_CARE_PROVIDER_SITE_OTHER): Payer: BC Managed Care – PPO | Admitting: Family Medicine

## 2020-04-14 ENCOUNTER — Encounter: Payer: Self-pay | Admitting: Family Medicine

## 2020-04-14 ENCOUNTER — Other Ambulatory Visit: Payer: Self-pay | Admitting: Family Medicine

## 2020-04-14 VITALS — BP 152/80 | HR 80 | Ht 68.0 in | Wt 210.0 lb

## 2020-04-14 DIAGNOSIS — H9193 Unspecified hearing loss, bilateral: Secondary | ICD-10-CM

## 2020-04-14 DIAGNOSIS — I10 Essential (primary) hypertension: Secondary | ICD-10-CM

## 2020-04-14 DIAGNOSIS — L723 Sebaceous cyst: Secondary | ICD-10-CM | POA: Diagnosis not present

## 2020-04-14 MED ORDER — METOPROLOL SUCCINATE ER 50 MG PO TB24: 50.0000 mg | ORAL_TABLET | Freq: Every day | ORAL | 5 refills | Status: DC

## 2020-04-14 NOTE — Patient Instructions (Signed)
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
## 2020-05-12 ENCOUNTER — Encounter: Payer: Self-pay | Admitting: Family Medicine

## 2020-05-12 ENCOUNTER — Ambulatory Visit (INDEPENDENT_AMBULATORY_CARE_PROVIDER_SITE_OTHER): Payer: BC Managed Care – PPO | Admitting: Family Medicine

## 2020-05-12 ENCOUNTER — Other Ambulatory Visit: Payer: Self-pay

## 2020-05-12 VITALS — BP 136/80 | HR 81 | Temp 98.2°F | Ht 68.0 in | Wt 209.4 lb

## 2020-05-12 DIAGNOSIS — I10 Essential (primary) hypertension: Secondary | ICD-10-CM

## 2020-05-12 NOTE — Progress Notes (Signed)
Established Patient Office Visit  Subjective:  Patient ID: Tyrone Jefferson, male    DOB: 1955/07/17  Age: 65 y.o. MRN: [id number]  CC:  Chief Complaint  Patient presents with  . Follow-up    HPI Tyrone Jefferson presents for follow-up hypertension.  We added Toprol-XL 50 mg daily last visit.  He had already been taking HCTZ, lisinopril, amlodipine.  He is tolerating Toprol without side effects.  Home blood pressures have been consistently 409 systolic and 73Z diastolic.  Denies any dizziness.  Past Medical History:  Diagnosis Date  . Chest pain   . Dyspnea   . Hyperlipidemia   . Hypertension   . Metabolic syndrome   . OSA (obstructive sleep apnea)   . Sleep apnea     Past Surgical History:  Procedure Laterality Date  . BACK SURGERY  1992  . SPINE SURGERY  1992   L4-5 discectomy    Family History  Problem Relation Age of Onset  . Heart attack Mother 31  . Hypertension Mother   . Diabetes Mother   . Brain cancer Mother   . Heart disease Father 50  . Colon cancer Father   . Rectal cancer Neg Hx   . Stomach cancer Neg Hx     Social History   Socioeconomic History  . Marital status: Married    Spouse name: Pamala Hurry  . Number of children: N  . Years of education: Not on file  . Highest education level: Not on file  Occupational History  . Occupation: route sales  Tobacco Use  . Smoking status: Never Smoker  . Smokeless tobacco: Never Used  Vaping Use  . Vaping Use: Never used  Substance and Sexual Activity  . Alcohol use: No  . Drug use: No  . Sexual activity: Not on file  Other Topics Concern  . Not on file  Social History Narrative  . Not on file   Social Determinants of Health   Financial Resource Strain: Not on file  Food Insecurity: Not on file  Transportation Needs: Not on file  Physical Activity: Not on file  Stress: Not on file  Social Connections: Not on file  Intimate Partner Violence: Not on file    Outpatient Medications Prior  to Visit  Medication Sig Dispense Refill  . amLODipine (NORVASC) 10 MG tablet Take 1 tablet (10 mg total) by mouth daily. 30 tablet 11  . atorvastatin (LIPITOR) 20 MG tablet Take 1 tablet (20 mg total) by mouth at bedtime. 90 tablet 3  . hydrochlorothiazide (HYDRODIURIL) 25 MG tablet TAKE 1 TABLET BY MOUTH EVERY DAY 30 tablet 2  . ibuprofen (ADVIL) 100 MG/5ML suspension Take 200 mg by mouth every 4 (four) hours as needed.    Marland Kitchen lisinopril (ZESTRIL) 40 MG tablet TAKE 1 TABLET BY MOUTH EVERY DAY 30 tablet 4  . metoprolol succinate (TOPROL-XL) 50 MG 24 hr tablet Take 1 tablet (50 mg total) by mouth daily. Take with or immediately following a meal. 30 tablet 5  . Multiple Vitamin (MULTIVITAMIN) tablet Take 1 tablet by mouth daily.    Marland Kitchen OVER THE COUNTER MEDICATION Co Q 10, 100 mg, daily.    Marland Kitchen MAGNESIUM ASPARTATE PO Take 0.5 tablets by mouth daily.     No facility-administered medications prior to visit.    No Known Allergies  ROS Review of Systems  Constitutional: Negative for fatigue.  Eyes: Negative for visual disturbance.  Respiratory: Negative for cough, chest tightness and shortness of breath.  Cardiovascular: Negative for chest pain, palpitations and leg swelling.  Neurological: Negative for dizziness, syncope, weakness, light-headedness and headaches.      Objective:    Physical Exam Constitutional:      Appearance: He is well-developed.  HENT:     Right Ear: External ear normal.     Left Ear: External ear normal.  Eyes:     Pupils: Pupils are equal, round, and reactive to light.  Neck:     Thyroid: No thyromegaly.  Cardiovascular:     Rate and Rhythm: Normal rate and regular rhythm.  Pulmonary:     Effort: Pulmonary effort is normal. No respiratory distress.     Breath sounds: Normal breath sounds. No wheezing or rales.  Musculoskeletal:     Cervical back: Neck supple.  Neurological:     Mental Status: He is alert and oriented to person, place, and time.     BP  136/80   Pulse 81   Temp 98.2 F (36.8 C) (Oral)   Ht 5\' 8"  (1.727 m)   Wt 209 lb 7 oz (95 kg)   SpO2 97%   BMI 31.84 kg/m  Wt Readings from Last 3 Encounters:  05/12/20 209 lb 7 oz (95 kg)  04/14/20 210 lb (95.3 kg)  01/13/20 207 lb (93.9 kg)     Health Maintenance Due  Topic Date Due  . HIV Screening  Never done    There are no preventive care reminders to display for this patient.  Lab Results  Component Value Date   TSH 3.27 11/11/2018   Lab Results  Component Value Date   WBC 8.3 11/11/2018   HGB 15.7 11/11/2018   HCT 44.6 11/11/2018   MCV 85.2 11/11/2018   PLT 282.0 11/11/2018   Lab Results  Component Value Date   NA 139 11/11/2018   K 3.3 (L) 11/11/2018   CO2 35 (H) 11/11/2018   GLUCOSE 103 (H) 11/11/2018   BUN 16 11/11/2018   CREATININE 0.85 11/11/2018   BILITOT 0.6 12/25/2018   ALKPHOS 64 12/25/2018   AST 15 12/25/2018   ALT 17 12/25/2018   PROT 7.1 12/25/2018   ALBUMIN 4.6 12/25/2018   CALCIUM 9.5 11/11/2018   GFR 91.16 11/11/2018   Lab Results  Component Value Date   CHOL 152 05/13/2019   Lab Results  Component Value Date   HDL 33.90 (L) 05/13/2019   Lab Results  Component Value Date   LDLCALC 116 (H) 08/07/2014   Lab Results  Component Value Date   TRIG 230.0 (H) 05/13/2019   Lab Results  Component Value Date   CHOLHDL 4 05/13/2019   No results found for: HGBA1C    Assessment & Plan:   Hypertension.  Improved by home readings and readings here today.  -Continue current medications -We have advised to try to lose some weight and we discussed nonpharmacologic management with regular exercise and sodium reduction.  No orders of the defined types were placed in this encounter.   Follow-up: Return in about 6 months (around 11/12/2020).    Carolann Littler, MD

## 2020-05-12 NOTE — Patient Instructions (Signed)

## 2020-06-21 ENCOUNTER — Encounter: Payer: Self-pay | Admitting: Family Medicine

## 2020-06-21 ENCOUNTER — Other Ambulatory Visit: Payer: Self-pay

## 2020-06-21 ENCOUNTER — Ambulatory Visit (INDEPENDENT_AMBULATORY_CARE_PROVIDER_SITE_OTHER): Payer: BC Managed Care – PPO | Admitting: Family Medicine

## 2020-06-21 VITALS — BP 140/80 | HR 72 | Temp 97.8°F | Wt 212.5 lb

## 2020-06-21 DIAGNOSIS — N481 Balanitis: Secondary | ICD-10-CM

## 2020-06-21 NOTE — Patient Instructions (Signed)
Keep area as dry as possible  Start Lotrimin cream 1% and use twice daily  Leave off the polysporin and vaseline.  Let me know if not clearing in two weeks.

## 2020-06-21 NOTE — Progress Notes (Signed)
Established Patient Office Visit  Subjective:  Patient ID: Tyrone Jefferson, male    DOB: [birth year]  Age: 65 y.o. MRN: [id number]  CC:  Chief Complaint  Patient presents with  . Rash    On foreskin of penis, x 3 weeks, burning pain    HPI Tyrone Jefferson presents for approximately 3-week history of some rash and irritation involving glans of the penis and distal shaft.  He is uncircumcised.  He also has some involvement of the skin which covers the glans.  He has had similar irritation in the past.  Usually tries to keep this area dry as possible.  He tried some Vaseline and over-the-counter Polysporin but if anything symptoms worsen.  No actual burning with urination.  Past Medical History:  Diagnosis Date  . Chest pain   . Dyspnea   . Hyperlipidemia   . Hypertension   . Metabolic syndrome   . OSA (obstructive sleep apnea)   . Sleep apnea     Past Surgical History:  Procedure Laterality Date  . BACK SURGERY  1992  . SPINE SURGERY  1992   L4-5 discectomy    Family History  Problem Relation Age of Onset  . Heart attack Mother 51  . Hypertension Mother   . Diabetes Mother   . Brain cancer Mother   . Heart disease Father 41  . Colon cancer Father   . Rectal cancer Neg Hx   . Stomach cancer Neg Hx     Social History   Socioeconomic History  . Marital status: Married    Spouse name: Pamala Hurry  . Number of children: N  . Years of education: Not on file  . Highest education level: Not on file  Occupational History  . Occupation: route sales  Tobacco Use  . Smoking status: Never Smoker  . Smokeless tobacco: Never Used  Vaping Use  . Vaping Use: Never used  Substance and Sexual Activity  . Alcohol use: No  . Drug use: No  . Sexual activity: Not on file  Other Topics Concern  . Not on file  Social History Narrative  . Not on file   Social Determinants of Health   Financial Resource Strain: Not on file  Food Insecurity: Not on file  Transportation  Needs: Not on file  Physical Activity: Not on file  Stress: Not on file  Social Connections: Not on file  Intimate Partner Violence: Not on file    Outpatient Medications Prior to Visit  Medication Sig Dispense Refill  . amLODipine (NORVASC) 10 MG tablet Take 1 tablet (10 mg total) by mouth daily. 30 tablet 11  . atorvastatin (LIPITOR) 20 MG tablet Take 1 tablet (20 mg total) by mouth at bedtime. 90 tablet 3  . hydrochlorothiazide (HYDRODIURIL) 25 MG tablet TAKE 1 TABLET BY MOUTH EVERY DAY 30 tablet 2  . ibuprofen (ADVIL) 100 MG/5ML suspension Take 200 mg by mouth every 4 (four) hours as needed.    Marland Kitchen lisinopril (ZESTRIL) 40 MG tablet TAKE 1 TABLET BY MOUTH EVERY DAY 30 tablet 4  . metoprolol succinate (TOPROL-XL) 50 MG 24 hr tablet Take 1 tablet (50 mg total) by mouth daily. Take with or immediately following a meal. 30 tablet 5  . Multiple Vitamin (MULTIVITAMIN) tablet Take 1 tablet by mouth daily.    Marland Kitchen OVER THE COUNTER MEDICATION Co Q 10, 100 mg, daily.     No facility-administered medications prior to visit.    No Known Allergies  ROS Review  of Systems  Constitutional: Negative for chills and fever.  Genitourinary: Negative for difficulty urinating, dysuria, genital sores and penile discharge.  Skin: Positive for rash.      Objective:    Physical Exam Vitals reviewed.  Constitutional:      Appearance: Normal appearance.  Cardiovascular:     Rate and Rhythm: Normal rate and regular rhythm.  Skin:    Comments: He has some diffuse redness involving the glans and distal part of the penile shaft as well as some of the skin overlying the glans.  No discharge.  No vesicles.  Neurological:     Mental Status: He is alert.     BP 140/80 (BP Location: Left Arm, Patient Position: Sitting, Cuff Size: Normal)   Pulse 72   Temp 97.8 F (36.6 C) (Oral)   Wt 212 lb 8 oz (96.4 kg)   SpO2 96%   BMI 32.31 kg/m  Wt Readings from Last 3 Encounters:  06/21/20 212 lb 8 oz (96.4 kg)   05/12/20 209 lb 7 oz (95 kg)  04/14/20 210 lb (95.3 kg)     Health Maintenance Due  Topic Date Due  . HIV Screening  Never done    There are no preventive care reminders to display for this patient.  Lab Results  Component Value Date   TSH 3.27 11/11/2018   Lab Results  Component Value Date   WBC 8.3 11/11/2018   HGB 15.7 11/11/2018   HCT 44.6 11/11/2018   MCV 85.2 11/11/2018   PLT 282.0 11/11/2018   Lab Results  Component Value Date   NA 139 11/11/2018   K 3.3 (L) 11/11/2018   CO2 35 (H) 11/11/2018   GLUCOSE 103 (H) 11/11/2018   BUN 16 11/11/2018   CREATININE 0.85 11/11/2018   BILITOT 0.6 12/25/2018   ALKPHOS 64 12/25/2018   AST 15 12/25/2018   ALT 17 12/25/2018   PROT 7.1 12/25/2018   ALBUMIN 4.6 12/25/2018   CALCIUM 9.5 11/11/2018   GFR 91.16 11/11/2018   Lab Results  Component Value Date   CHOL 152 05/13/2019   Lab Results  Component Value Date   HDL 33.90 (L) 05/13/2019   Lab Results  Component Value Date   LDLCALC 116 (H) 08/07/2014   Lab Results  Component Value Date   TRIG 230.0 (H) 05/13/2019   Lab Results  Component Value Date   CHOLHDL 4 05/13/2019   No results found for: HGBA1C    Assessment & Plan:   Balanitis in uncircumcised male.  -He is aware of good hygiene and tries to keep this as dry as possible. -Recommend leaving off the Polysporin and Vaseline -Recommend trial of over-the-counter clotrimazole 1% cream twice daily -Be in touch if this is not clearing over the next couple of weeks  No orders of the defined types were placed in this encounter.   Follow-up: No follow-ups on file.    Carolann Littler, MD

## 2020-07-10 ENCOUNTER — Other Ambulatory Visit: Payer: Self-pay | Admitting: Family Medicine

## 2020-10-08 ENCOUNTER — Other Ambulatory Visit: Payer: Self-pay | Admitting: Family Medicine

## 2020-10-11 ENCOUNTER — Other Ambulatory Visit: Payer: Self-pay | Admitting: Family Medicine

## 2020-11-05 ENCOUNTER — Other Ambulatory Visit: Payer: Self-pay | Admitting: Family Medicine

## 2020-11-12 ENCOUNTER — Ambulatory Visit: Payer: BC Managed Care – PPO | Admitting: Family Medicine

## 2020-11-22 ENCOUNTER — Other Ambulatory Visit: Payer: Self-pay

## 2020-11-22 ENCOUNTER — Ambulatory Visit (INDEPENDENT_AMBULATORY_CARE_PROVIDER_SITE_OTHER): Payer: BC Managed Care – PPO | Admitting: Family Medicine

## 2020-11-22 VITALS — BP 160/70 | HR 79 | Temp 97.9°F | Wt 210.6 lb

## 2020-11-22 DIAGNOSIS — I10 Essential (primary) hypertension: Secondary | ICD-10-CM | POA: Diagnosis not present

## 2020-11-22 DIAGNOSIS — G4733 Obstructive sleep apnea (adult) (pediatric): Secondary | ICD-10-CM

## 2020-11-22 NOTE — Patient Instructions (Addendum)
Set up complete physical  I will set up sleep apnea re-evaluation  Try to lose some weight- as discussed.    Consider trial of over the counter melatonin (5 mg) for sleep disturbance.

## 2020-11-22 NOTE — Progress Notes (Signed)
Established Patient Office Visit  Subjective:  Patient ID: Tyrone Jefferson, male    DOB: Nov 06, 1955  Age: 65 y.o. MRN: [id number]  CC:  Chief Complaint  Patient presents with   Follow-up    Hypertension, avg 137/80 at home    HPI Tyrone Jefferson presents for medical follow-up.  He has history of metabolic syndrome, obstructive sleep apnea, hypertension.  He was studied for sleep apnea back in 2012 and had moderate apnea by study then.  Never treated with CPAP.  He has chronic sleep disturbance.  Has difficulty staying asleep.  Tries avoid late the use of caffeine.  No alcohol use.  Is overweight and is struggled to lose weight.  His wife has observed that he snores nightly and frequently gasps for air.    He states that his blood pressures have averaged 137/88 by home readings over recent weeks.  Currently treated with amlodipine, HCTZ, lisinopril, and metoprolol.  He states he is compliant with medications.  No chest pains, dizziness, or headaches.  Tries to avoid sodium intake.  Overdue for physical.  Past Medical History:  Diagnosis Date   Chest pain    Dyspnea    Hyperlipidemia    Hypertension    Metabolic syndrome    OSA (obstructive sleep apnea)    Sleep apnea     Past Surgical History:  Procedure Laterality Date   BACK SURGERY  1992   SPINE SURGERY  1992   L4-5 discectomy    Family History  Problem Relation Age of Onset   Heart attack Mother 6   Hypertension Mother    Diabetes Mother    Brain cancer Mother    Heart disease Father 76   Colon cancer Father    Rectal cancer Neg Hx    Stomach cancer Neg Hx     Social History   Socioeconomic History   Marital status: Married    Spouse name: Pamala Hurry   Number of children: N   Years of education: Not on file   Highest education level: Not on file  Occupational History   Occupation: route sales  Tobacco Use   Smoking status: Never   Smokeless tobacco: Never  Vaping Use   Vaping Use: Never used   Substance and Sexual Activity   Alcohol use: No   Drug use: No   Sexual activity: Not on file  Other Topics Concern   Not on file  Social History Narrative   Not on file   Social Determinants of Health   Financial Resource Strain: Not on file  Food Insecurity: Not on file  Transportation Needs: Not on file  Physical Activity: Not on file  Stress: Not on file  Social Connections: Not on file  Intimate Partner Violence: Not on file    Outpatient Medications Prior to Visit  Medication Sig Dispense Refill   amLODipine (NORVASC) 10 MG tablet TAKE 1 TABLET BY MOUTH EVERY DAY 30 tablet 2   atorvastatin (LIPITOR) 20 MG tablet Take 1 tablet (20 mg total) by mouth at bedtime. 90 tablet 3   hydrochlorothiazide (HYDRODIURIL) 25 MG tablet TAKE 1 TABLET BY MOUTH EVERY DAY 30 tablet 0   ibuprofen (ADVIL) 100 MG/5ML suspension Take 200 mg by mouth every 4 (four) hours as needed.     lisinopril (ZESTRIL) 40 MG tablet TAKE 1 TABLET BY MOUTH EVERY DAY 30 tablet 4   metoprolol succinate (TOPROL-XL) 50 MG 24 hr tablet TAKE 1 TABLET BY MOUTH DAILY IMMEDIATELY FOLLOWING A MEAL  30 tablet 0   Multiple Vitamin (MULTIVITAMIN) tablet Take 1 tablet by mouth daily.     OVER THE COUNTER MEDICATION Co Q 10, 100 mg, daily.     No facility-administered medications prior to visit.    No Known Allergies  ROS Review of Systems  Constitutional:  Negative for fatigue.  Eyes:  Negative for visual disturbance.  Respiratory:  Negative for cough, chest tightness and shortness of breath.   Cardiovascular:  Negative for chest pain, palpitations and leg swelling.  Endocrine: Negative for polydipsia and polyuria.  Neurological:  Negative for dizziness, syncope, weakness, light-headedness and headaches.  Psychiatric/Behavioral:  Positive for sleep disturbance.      Objective:    Physical Exam Constitutional:      Appearance: He is well-developed.  HENT:     Right Ear: External ear normal.     Left Ear:  External ear normal.  Eyes:     Pupils: Pupils are equal, round, and reactive to light.  Neck:     Thyroid: No thyromegaly.  Cardiovascular:     Rate and Rhythm: Normal rate and regular rhythm.  Pulmonary:     Effort: Pulmonary effort is normal. No respiratory distress.     Breath sounds: Normal breath sounds. No wheezing or rales.  Musculoskeletal:     Cervical back: Neck supple.     Right lower leg: No edema.     Left lower leg: No edema.  Neurological:     Mental Status: He is alert and oriented to person, place, and time.    BP (!) 160/70 (BP Location: Left Arm, Patient Position: Sitting, Cuff Size: Normal)   Pulse 79   Temp 97.9 F (36.6 C) (Oral)   Wt 210 lb 9.6 oz (95.5 kg)   SpO2 97%   BMI 32.02 kg/m  Wt Readings from Last 3 Encounters:  11/22/20 210 lb 9.6 oz (95.5 kg)  06/21/20 212 lb 8 oz (96.4 kg)  05/12/20 209 lb 7 oz (95 kg)     Health Maintenance Due  Topic Date Due   HIV Screening  Never done   Zoster Vaccines- Shingrix (1 of 2) Never done   COVID-19 Vaccine (4 - Booster for Pfizer series) 05/26/2020   INFLUENZA VACCINE  09/20/2020    There are no preventive care reminders to display for this patient.  Lab Results  Component Value Date   TSH 3.27 11/11/2018   Lab Results  Component Value Date   WBC 8.3 11/11/2018   HGB 15.7 11/11/2018   HCT 44.6 11/11/2018   MCV 85.2 11/11/2018   PLT 282.0 11/11/2018   Lab Results  Component Value Date   NA 139 11/11/2018   K 3.3 (L) 11/11/2018   CO2 35 (H) 11/11/2018   GLUCOSE 103 (H) 11/11/2018   BUN 16 11/11/2018   CREATININE 0.85 11/11/2018   BILITOT 0.6 12/25/2018   ALKPHOS 64 12/25/2018   AST 15 12/25/2018   ALT 17 12/25/2018   PROT 7.1 12/25/2018   ALBUMIN 4.6 12/25/2018   CALCIUM 9.5 11/11/2018   GFR 91.16 11/11/2018   Lab Results  Component Value Date   CHOL 152 05/13/2019   Lab Results  Component Value Date   HDL 33.90 (L) 05/13/2019   Lab Results  Component Value Date    LDLCALC 116 (H) 08/07/2014   Lab Results  Component Value Date   TRIG 230.0 (H) 05/13/2019   Lab Results  Component Value Date   CHOLHDL 4 05/13/2019   No results  found for: HGBA1C    Assessment & Plan:   #1 hypertension poorly controlled by today's reading.  He has had consistently much better control by home readings.  He does have history of obstructive sleep apnea currently not treated which may be exacerbating.  Also his weight is likely playing a factor.  We discussed the following  -Continue to monitor closely at home -Watch sodium intake -Set up repeat sleep apnea evaluation-see below -Strongly encouraged him to try to lose some additional weight -If not seeing response with weight loss and possible treatment of his apnea consider additional medication  #2 history of obstructive sleep apnea.  He has chronic sleep disturbance.  Last studied over 10 years ago. -Set up referral for sleep apnea reevaluation  #3 set up complete physical. We will plan to get follow-up labs then including lipids     Follow-up: No follow-ups on file.    Carolann Littler, MD

## 2020-12-03 NOTE — Progress Notes (Signed)
12/06/20- 65 yoM never smoker for sleep evaluation with concern of OSA courtesy of Dr Elease Hashimoto Medical problem list includes HTN, OSA, Obesity, Hyperlipidemia, Metabolic Syndrome, NPSG 1/60/10- AHI 19.7/ hr, desaturation to 85%, body weight 200 lbs, CPAP to 9 Epworth score-7 Body weight today-210 lbs Covid vax- Flu vax- Never started Rx after meeting once with Dr Gwenette Greet. ------Pt. Wants to talk about his lack of sleep.  Told to lose weight after NPSG in 2012. No other therapy offered. Dentist didd home sleep study several years ago and made oral appliance which never fit well and was abandoned.  No EBT surgery, heart or lung disease. No famhx OSA. 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  He is bothered by frequent waking with need to urinate. 1-2 naps/ day after meals, since retired. He and wife in separate bedrooms. She thinks he snores. No significant movement disturbance or complex parasomnia. No sleep meds. May drink iced tea into evening.   Prior to Admission medications   Medication Sig Start Date End Date Taking? Authorizing Provider  amLODipine (NORVASC) 10 MG tablet TAKE 1 TABLET BY MOUTH EVERY DAY 10/11/20  Yes Burchette, Alinda Sierras, MD  hydrochlorothiazide (HYDRODIURIL) 25 MG tablet TAKE 1 TABLET BY MOUTH EVERY DAY 11/05/20  Yes Burchette, Alinda Sierras, MD  ibuprofen (ADVIL) 100 MG/5ML suspension Take 200 mg by mouth every 4 (four) hours as needed.   Yes [provider]  lisinopril (ZESTRIL) 40 MG tablet TAKE 1 TABLET BY MOUTH EVERY DAY 07/12/20  Yes Burchette, Alinda Sierras, MD  metoprolol succinate (TOPROL-XL) 50 MG 24 hr tablet TAKE 1 TABLET BY MOUTH DAILY IMMEDIATELY FOLLOWING A MEAL 11/05/20  Yes Burchette, Alinda Sierras, MD  Multiple Vitamin (MULTIVITAMIN) tablet Take 1 tablet by mouth daily.   Yes [provider]  OVER THE COUNTER MEDICATION Co Q 10, 100 mg, daily.   Yes [provider]   atorvastatin (LIPITOR) 20 MG tablet Take 1 tablet (20 mg total) by mouth at bedtime. Patient not taking: Reported on 12/06/2020 11/13/18   [name], MD   Past Medical History:  Diagnosis Date   Chest pain    Dyspnea    Hyperlipidemia    Hypertension    Metabolic syndrome    OSA (obstructive sleep apnea)    Sleep apnea    Past Surgical History:  Procedure Laterality Date   BACK SURGERY  1992   SPINE SURGERY  1992   L4-5 discectomy   Family History  Problem Relation Age of Onset   Heart attack Mother 53   Hypertension Mother    Diabetes Mother    Brain cancer Mother    Heart disease Father 75   Colon cancer Father    Rectal cancer Neg Hx    Stomach cancer Neg Hx    Social History   Socioeconomic History   Marital status: Married    Spouse name: Pamala Hurry   Number of children: N   Years of education: Not on file   Highest education level: Not on file  Occupational History   Occupation: route sales  Tobacco Use   Smoking status: Never   Smokeless tobacco: Never  Vaping Use   Vaping Use: Never used  Substance and Sexual Activity   Alcohol use: No   Drug use: No   Sexual activity: Not on file  Other Topics Concern   Not on file  Social History Narrative   Not on file  Social Determinants of Health   Financial Resource Strain: Not on file  Food Insecurity: Not on file  Transportation Needs: Not on file  Physical Activity: Not on file  Stress: Not on file  Social Connections: Not on file  Intimate Partner Violence: Not on file   ROS-see HPI   + = positive Constitutional:    weight loss, night sweats, fevers, chills, fatigue, lassitude. HEENT:    headaches, difficulty swallowing, tooth/dental problems, sore throat,       sneezing, itching, ear ache, nasal congestion, post nasal drip, snoring CV:    chest pain, orthopnea, PND, swelling in lower extremities, anasarca,                                   dizziness, palpitations Resp:   shortness of  breath with exertion or at rest.                productive cough,   non-productive cough, coughing up of blood.              change in color of mucus.  wheezing.   Skin:    rash or lesions. GI:  No-   heartburn, indigestion, abdominal pain, nausea, vomiting, diarrhea,                 change in bowel habits, loss of appetite GU: dysuria, change in color of urine, no urgency or frequency.   flank pain. MS:   joint pain, stiffness, decreased range of motion, back pain. Neuro-     nothing unusual Psych:  change in mood or affect.  depression or anxiety.   memory loss.  OBJ- Physical Exam General- Alert, Oriented, Affect-appropriate, Distress- none acute Skin- rash-none, lesions- none, excoriation- none Lymphadenopathy- none Head- atraumatic            Eyes- Gross vision intact, PERRLA, conjunctivae and secretions clear            Ears- Hearing, canals-normal            Nose- Clear, no-Septal dev, mucus, polyps, erosion, perforation             Throat- Mallampati IV , mucosa clear , drainage- none, tonsils- atrophic, + teeth Neck- flexible , trachea midline, no stridor , thyroid nl, carotid no bruit Chest - symmetrical excursion , unlabored           Heart/CV- RRR , no murmur , no gallop  , no rub, nl s1 s2                           - JVD- none , edema- none, stasis changes- none, varices- none           Lung- clear to P&A, wheeze- none, cough- none , dullness-none, rub- none           Chest wall-  Abd-  Br/ Gen/ Rectal- Not done, not indicated Extrem- cyanosis- none, clubbing, none, atrophy- none, strength- nl Neuro- grossly intact to observation

## 2020-12-06 ENCOUNTER — Ambulatory Visit (INDEPENDENT_AMBULATORY_CARE_PROVIDER_SITE_OTHER): Payer: BC Managed Care – PPO | Admitting: Internal Medicine

## 2020-12-06 ENCOUNTER — Encounter: Payer: Self-pay | Admitting: Internal Medicine

## 2020-12-06 ENCOUNTER — Other Ambulatory Visit: Payer: Self-pay

## 2020-12-06 VITALS — BP 130/72 | HR 83 | Temp 97.8°F | Ht 68.0 in | Wt 210.4 lb

## 2020-12-06 DIAGNOSIS — E669 Obesity, unspecified: Secondary | ICD-10-CM

## 2020-12-06 DIAGNOSIS — G4733 Obstructive sleep apnea (adult) (pediatric): Secondary | ICD-10-CM | POA: Diagnosis not present

## 2020-12-06 DIAGNOSIS — R351 Nocturia: Secondary | ICD-10-CM

## 2020-12-06 NOTE — Patient Instructions (Signed)
Order- schedule home sleep test   dx OSA  Please call us for results and recommendations about 2 weeks after your sleep test

## 2020-12-06 NOTE — Assessment & Plan Note (Signed)
Some weight loss would likely help OSA.

## 2020-12-06 NOTE — Assessment & Plan Note (Signed)
Very likely significant sleep apnea contributes to the frequent waking which is his primary concern. Appropriate discussion and questions answered. Plan- sleep study. CPAP is likely best option. Can refer to ENT later if needed.

## 2020-12-06 NOTE — Assessment & Plan Note (Signed)
OSA can increase nocturia. At his age, BPH is quite possible and he may benefit from Urology appointment.

## 2020-12-07 ENCOUNTER — Other Ambulatory Visit: Payer: Self-pay | Admitting: Family Medicine

## 2020-12-11 ENCOUNTER — Other Ambulatory Visit: Payer: Self-pay | Admitting: Family Medicine

## 2020-12-21 ENCOUNTER — Ambulatory Visit (INDEPENDENT_AMBULATORY_CARE_PROVIDER_SITE_OTHER): Payer: BC Managed Care – PPO | Admitting: Family Medicine

## 2020-12-21 ENCOUNTER — Encounter: Payer: Self-pay | Admitting: Family Medicine

## 2020-12-21 ENCOUNTER — Other Ambulatory Visit: Payer: Self-pay

## 2020-12-21 VITALS — BP 148/84 | HR 75 | Temp 98.1°F | Ht 68.0 in | Wt 206.4 lb

## 2020-12-21 DIAGNOSIS — Z0000 Encounter for general adult medical examination without abnormal findings: Secondary | ICD-10-CM | POA: Diagnosis not present

## 2020-12-21 LAB — BASIC METABOLIC PANEL
BUN: 15 mg/dL (ref 6–23)
CO2: 36 mEq/L — ABNORMAL HIGH (ref 19–32)
Calcium: 9 mg/dL (ref 8.4–10.5)
Chloride: 94 mEq/L — ABNORMAL LOW (ref 96–112)
Creatinine, Ser: 0.94 mg/dL (ref 0.40–1.50)
GFR: 85.55 mL/min (ref >60.00)
Glucose, Bld: 232 mg/dL — ABNORMAL HIGH (ref 70–99)
Potassium: 3 mEq/L — ABNORMAL LOW (ref 3.5–5.1)
Sodium: 138 mEq/L (ref 135–145)

## 2020-12-21 LAB — CBC WITH DIFFERENTIAL/PLATELET
Basophils Absolute: 0 10*3/uL (ref 0.0–0.1)
Basophils Relative: 0.5 % (ref 0.0–3.0)
Eosinophils Absolute: 0.1 10*3/uL (ref 0.0–0.7)
Eosinophils Relative: 1.2 % (ref 0.0–5.0)
HCT: 47.6 % (ref 39.0–52.0)
Hemoglobin: 16.5 g/dL (ref 13.0–17.0)
Lymphocytes Relative: 26.5 % (ref 12.0–46.0)
Lymphs Abs: 2.1 10*3/uL (ref 0.7–4.0)
MCHC: 34.6 g/dL (ref 30.0–36.0)
MCV: 85.9 fl (ref 78.0–100.0)
Monocytes Absolute: 0.7 10*3/uL (ref 0.1–1.0)
Monocytes Relative: 9.3 % (ref 3.0–12.0)
Neutro Abs: 5 10*3/uL (ref 1.4–7.7)
Neutrophils Relative %: 62.5 % (ref 43.0–77.0)
Platelets: 269 10*3/uL (ref 150.0–400.0)
RBC: 5.55 Mil/uL (ref 4.22–5.81)
RDW: 13.6 % (ref 11.5–15.5)
WBC: 8 10*3/uL (ref 4.0–10.5)

## 2020-12-21 LAB — LDL CHOLESTEROL, DIRECT: Direct LDL: 147 mg/dL

## 2020-12-21 LAB — LIPID PANEL
Cholesterol: 215 mg/dL — ABNORMAL HIGH (ref 0–200)
HDL: 33.3 mg/dL — ABNORMAL LOW (ref >39.00)
NonHDL: 182.14
Total CHOL/HDL Ratio: 6
Triglycerides: 321 mg/dL — ABNORMAL HIGH (ref 0.0–149.0)
VLDL: 64.2 mg/dL — ABNORMAL HIGH (ref 0.0–40.0)

## 2020-12-21 LAB — HEPATIC FUNCTION PANEL
ALT: 20 U/L (ref 0–53)
AST: 16 U/L (ref 0–37)
Albumin: 4.5 g/dL (ref 3.5–5.2)
Alkaline Phosphatase: 78 U/L (ref 39–117)
Bilirubin, Direct: 0.1 mg/dL (ref 0.0–0.3)
Total Bilirubin: 0.7 mg/dL (ref 0.2–1.2)
Total Protein: 7.3 g/dL (ref 6.0–8.3)

## 2020-12-21 LAB — PSA: PSA: 1.76 ng/mL (ref 0.10–4.00)

## 2020-12-21 LAB — TSH: TSH: 4.25 u[IU]/mL (ref 0.35–5.50)

## 2020-12-21 NOTE — Patient Instructions (Signed)
Set up 4 month follow up   Reduce sugars and starches and lose some weight.

## 2020-12-21 NOTE — Progress Notes (Signed)
Established Patient Office Visit  Subjective:  Patient ID: Tyrone Jefferson, male    DOB: 16-Sep-1955  Age: 65 y.o. MRN: [id number]  CC:  Chief Complaint  Patient presents with   Annual Exam    HPI Tyrone Jefferson presents for physical exam.  He has history of hypertension, obstructive sleep apnea, metabolic syndrome.Marland Kitchen  His blood pressures been challenging to control.  Home blood pressures still ranging between 643 systolic and occasional high of around 160.  He is retired and currently not exercising regularly.  We have encouraged him to lose weight in the past. Health maintenance reviewed:   -He declines flu vaccine -Previous hepatitis C screen negative -Tetanus due 2023 -Will be due for pneumonia vaccine at age 35 -Colonoscopy due 11/25.  He gets these every 5 years because of family history of colon cancer in his father. -No history of Shingrix vaccine and he declines  Family history-Father had colon cancer.  His mother had history of CAD in her late 86s and apparently some type of brain tumor.  He had a brother with history of stroke.  Social history-he retired 2020 from [employer].  Wife is also retired.  They have no children.  He has never smoked.  No alcohol.  Past Medical History:  Diagnosis Date   Chest pain    Dyspnea    Hyperlipidemia    Hypertension    Metabolic syndrome    OSA (obstructive sleep apnea)    Sleep apnea     Past Surgical History:  Procedure Laterality Date   BACK SURGERY  1992   SPINE SURGERY  1992   L4-5 discectomy    Family History  Problem Relation Age of Onset   Brain cancer Mother 9   Heart attack Mother 86   Hypertension Mother    Diabetes Mother    Heart disease Father 37   Colon cancer Father    Stroke Brother    Rectal cancer Neg Hx    Stomach cancer Neg Hx     Social History   Socioeconomic History   Marital status: Married    Spouse name: Pamala Hurry   Number of children: N   Years of education: Not on  file   Highest education level: Not on file  Occupational History   Occupation: route sales  Tobacco Use   Smoking status: Never   Smokeless tobacco: Never  Vaping Use   Vaping Use: Never used  Substance and Sexual Activity   Alcohol use: No   Drug use: No   Sexual activity: Not on file  Other Topics Concern   Not on file  Social History Narrative   Not on file   Social Determinants of Health   Financial Resource Strain: Not on file  Food Insecurity: Not on file  Transportation Needs: Not on file  Physical Activity: Not on file  Stress: Not on file  Social Connections: Not on file  Intimate Partner Violence: Not on file    Outpatient Medications Prior to Visit  Medication Sig Dispense Refill   amLODipine (NORVASC) 10 MG tablet TAKE 1 TABLET BY MOUTH EVERY DAY 30 tablet 2   atorvastatin (LIPITOR) 20 MG tablet Take 1 tablet (20 mg total) by mouth at bedtime. 90 tablet 3   hydrochlorothiazide (HYDRODIURIL) 25 MG tablet TAKE 1 TABLET BY MOUTH EVERY DAY 30 tablet 0   ibuprofen (ADVIL) 100 MG/5ML suspension Take 200 mg by mouth every 4 (four) hours as needed.     lisinopril (  ZESTRIL) 40 MG tablet TAKE 1 TABLET BY MOUTH EVERY DAY 30 tablet 4   metoprolol succinate (TOPROL-XL) 50 MG 24 hr tablet TAKE 1 TABLET BY MOUTH DAILY IMMEDIATELY FOLLOWING A MEAL 30 tablet 0   Multiple Vitamin (MULTIVITAMIN) tablet Take 1 tablet by mouth daily.     OVER THE COUNTER MEDICATION Co Q 10, 100 mg, daily.     No facility-administered medications prior to visit.    No Known Allergies  ROS Review of Systems  Constitutional:  Negative for activity change, appetite change, fatigue, fever and unexpected weight change.  HENT:  Negative for congestion, ear pain and trouble swallowing.   Eyes:  Negative for pain and visual disturbance.  Respiratory:  Negative for cough, shortness of breath and wheezing.   Cardiovascular:  Negative for chest pain and palpitations.  Gastrointestinal:  Negative for  abdominal distention, abdominal pain, blood in stool, constipation, diarrhea, nausea, rectal pain and vomiting.  Endocrine: Negative for polydipsia and polyuria.  Genitourinary:  Negative for dysuria, hematuria and testicular pain.  Musculoskeletal:  Negative for arthralgias and joint swelling.  Skin:  Negative for rash.  Neurological:  Negative for dizziness, syncope and headaches.  Hematological:  Negative for adenopathy.  Psychiatric/Behavioral:  Negative for confusion and dysphoric mood.      Objective:    Physical Exam Constitutional:      General: He is not in acute distress.    Appearance: He is well-developed.  HENT:     Head: Normocephalic and atraumatic.     Right Ear: External ear normal.     Left Ear: External ear normal.  Eyes:     Conjunctiva/sclera: Conjunctivae normal.     Pupils: Pupils are equal, round, and reactive to light.  Neck:     Thyroid: No thyromegaly.  Cardiovascular:     Rate and Rhythm: Normal rate and regular rhythm.     Heart sounds: Normal heart sounds. No murmur heard. Pulmonary:     Effort: No respiratory distress.     Breath sounds: No wheezing or rales.  Abdominal:     General: Bowel sounds are normal. There is no distension.     Palpations: Abdomen is soft. There is no mass.     Tenderness: There is no abdominal tenderness. There is no guarding or rebound.  Musculoskeletal:     Cervical back: Normal range of motion and neck supple.     Right lower leg: No edema.     Left lower leg: No edema.  Lymphadenopathy:     Cervical: No cervical adenopathy.  Skin:    Findings: No rash.  Neurological:     Mental Status: He is alert and oriented to person, place, and time.     Cranial Nerves: No cranial nerve deficit.    BP (!) 148/84 (BP Location: Left Arm, Cuff Size: Normal)   Pulse 75   Temp 98.1 F (36.7 C) (Oral)   Ht 5\' 8"  (1.727 m)   Wt 206 lb 6.4 oz (93.6 kg)   SpO2 98%   BMI 31.38 kg/m  Wt Readings from Last 3 Encounters:   12/21/20 206 lb 6.4 oz (93.6 kg)  12/06/20 210 lb 6.4 oz (95.4 kg)  11/22/20 210 lb 9.6 oz (95.5 kg)     Health Maintenance Due  Topic Date Due   HIV Screening  Never done   Zoster Vaccines- Shingrix (1 of 2) Never done   COVID-19 Vaccine (4 - Booster for Pfizer series) 03/22/2020    There are  no preventive care reminders to display for this patient.  Lab Results  Component Value Date   TSH 3.27 11/11/2018   Lab Results  Component Value Date   WBC 8.3 11/11/2018   HGB 15.7 11/11/2018   HCT 44.6 11/11/2018   MCV 85.2 11/11/2018   PLT 282.0 11/11/2018   Lab Results  Component Value Date   NA 139 11/11/2018   K 3.3 (L) 11/11/2018   CO2 35 (H) 11/11/2018   GLUCOSE 103 (H) 11/11/2018   BUN 16 11/11/2018   CREATININE 0.85 11/11/2018   BILITOT 0.6 12/25/2018   ALKPHOS 64 12/25/2018   AST 15 12/25/2018   ALT 17 12/25/2018   PROT 7.1 12/25/2018   ALBUMIN 4.6 12/25/2018   CALCIUM 9.5 11/11/2018   GFR 91.16 11/11/2018   Lab Results  Component Value Date   CHOL 152 05/13/2019   Lab Results  Component Value Date   HDL 33.90 (L) 05/13/2019   Lab Results  Component Value Date   LDLCALC 116 (H) 08/07/2014   Lab Results  Component Value Date   TRIG 230.0 (H) 05/13/2019   Lab Results  Component Value Date   CHOLHDL 4 05/13/2019   No results found for: HGBA1C    Assessment & Plan:   Problem List Items Addressed This Visit   None Visit Diagnoses     Physical exam    -  Primary   Relevant Orders   Basic metabolic panel   Lipid panel   CBC with Differential/Platelet   TSH   Hepatic function panel   PSA     Patient has longstanding hypertension history which has been resistant to treatment.  Currently not exercising and we discussed options of additional medication such as hydralazine versus lifestyle modification and close follow-up.  We strongly recommend he try to lose some weight and establish consistent aerobic exercise.  Handout on Dash diet given.   We strongly challenged him to try to lose 8 to 10 pounds and recommend follow-up in 3 to 4 months.  If not further to goal at that point with blood pressure control consider addition of hydralazine  -Recommend Prevnar 20 by age 9  -We discussed Shingrix vaccine and he declines at this time.  -Flu vaccine offered but declined  No orders of the defined types were placed in this encounter.   Follow-up: Return in about 4 months (around 04/20/2021).    Carolann Littler, MD

## 2020-12-22 ENCOUNTER — Other Ambulatory Visit: Payer: Self-pay

## 2020-12-22 ENCOUNTER — Telehealth: Payer: Self-pay | Admitting: Family Medicine

## 2020-12-22 MED ORDER — POTASSIUM CHLORIDE ER 10 MEQ PO TBCR: 10.0000 meq | EXTENDED_RELEASE_TABLET | Freq: Every day | ORAL | 1 refills | Status: DC

## 2020-12-22 NOTE — Telephone Encounter (Signed)
See result notes. 

## 2020-12-22 NOTE — Telephone Encounter (Signed)
Pt call and stated he is returning your call about his labs and want a call back.

## 2020-12-23 ENCOUNTER — Institutional Professional Consult (permissible substitution): Payer: BC Managed Care – PPO | Admitting: Pulmonary Disease

## 2021-01-04 ENCOUNTER — Other Ambulatory Visit: Payer: Self-pay | Admitting: Family Medicine

## 2021-01-08 ENCOUNTER — Other Ambulatory Visit: Payer: Self-pay | Admitting: Family Medicine

## 2021-01-11 ENCOUNTER — Encounter: Payer: Self-pay | Admitting: Family Medicine

## 2021-01-11 ENCOUNTER — Ambulatory Visit (INDEPENDENT_AMBULATORY_CARE_PROVIDER_SITE_OTHER): Payer: BC Managed Care – PPO | Admitting: Family Medicine

## 2021-01-11 VITALS — BP 130/80 | HR 62 | Temp 97.7°F | Wt 200.8 lb

## 2021-01-11 DIAGNOSIS — E1165 Type 2 diabetes mellitus with hyperglycemia: Secondary | ICD-10-CM | POA: Insufficient documentation

## 2021-01-11 DIAGNOSIS — R739 Hyperglycemia, unspecified: Secondary | ICD-10-CM | POA: Diagnosis not present

## 2021-01-11 DIAGNOSIS — E876 Hypokalemia: Secondary | ICD-10-CM

## 2021-01-11 LAB — BASIC METABOLIC PANEL
BUN: 16 mg/dL (ref 6–23)
CO2: 36 mEq/L — ABNORMAL HIGH (ref 19–32)
Calcium: 9 mg/dL (ref 8.4–10.5)
Chloride: 94 mEq/L — ABNORMAL LOW (ref 96–112)
Creatinine, Ser: 1.02 mg/dL (ref 0.40–1.50)
GFR: 77.53 mL/min (ref >60.00)
Glucose, Bld: 157 mg/dL — ABNORMAL HIGH (ref 70–99)
Potassium: 3 mEq/L — ABNORMAL LOW (ref 3.5–5.1)
Sodium: 138 mEq/L (ref 135–145)

## 2021-01-11 LAB — POCT GLYCOSYLATED HEMOGLOBIN (HGB A1C): Hemoglobin A1C: 9 % — AB (ref 4.0–5.6)

## 2021-01-11 MED ORDER — ROSUVASTATIN CALCIUM 10 MG PO TABS: 10.0000 mg | ORAL_TABLET | Freq: Every day | ORAL | 5 refills | Status: DC

## 2021-01-11 MED ORDER — METFORMIN HCL 500 MG PO TABS: 500.0000 mg | ORAL_TABLET | Freq: Two times a day (BID) | ORAL | 3 refills | Status: DC

## 2021-01-11 NOTE — Progress Notes (Signed)
Established Patient Office Visit  Subjective:  Patient ID: Tyrone Jefferson, male    DOB: 11-11-1955  Age: 65 y.o. MRN: [id number]  CC:  Chief Complaint  Patient presents with   Follow-up    HPI Tyrone Jefferson presents to discuss some abnormal labs from recent physical.  He had glucose of 232 which is fasting.  Also had low potassium of 3.0.  We increased his potassium supplementation.  Patient has made some lifestyle changes with reducing sugars and starches.  He is given up sweet tea.  Does feel somewhat better overall.  His weight is down about 5 pounds from last visit.  He has no prior history of diabetes but does have family history of diabetes in his mother.  Currently no consistent exercise.  His urine frequency has decreased somewhat since losing some weight and making some dietary changes.  His current medications include amlodipine, HCTZ, lisinopril, metoprolol.  He had been on Lipitor for hyperlipidemia.  He states he had upset stomach symptoms when he was on the Lipitor.  He recalls similar side effects when he took simvastatin.  Recent blood pressure was still elevated 148/84 but improved today.  Past Medical History:  Diagnosis Date   Chest pain    Dyspnea    Hyperlipidemia    Hypertension    Metabolic syndrome    OSA (obstructive sleep apnea)    Sleep apnea     Past Surgical History:  Procedure Laterality Date   BACK SURGERY  1992   SPINE SURGERY  1992   L4-5 discectomy    Family History  Problem Relation Age of Onset   Brain cancer Mother 15   Heart attack Mother 45   Hypertension Mother    Diabetes Mother    Heart disease Father 92   Colon cancer Father    Stroke Brother    Rectal cancer Neg Hx    Stomach cancer Neg Hx     Social History   Socioeconomic History   Marital status: Married    Spouse name: Pamala Hurry   Number of children: N   Years of education: Not on file   Highest education level: Bachelor's degree (e.g., BA, AB, BS)   Occupational History   Occupation: route sales  Tobacco Use   Smoking status: Never   Smokeless tobacco: Never  Vaping Use   Vaping Use: Never used  Substance and Sexual Activity   Alcohol use: No   Drug use: No   Sexual activity: Not on file  Other Topics Concern   Not on file  Social History Narrative   Not on file   Social Determinants of Health   Financial Resource Strain: Low Risk    Difficulty of Paying Living Expenses: Not hard at all  Food Insecurity: No Food Insecurity   Worried About Charity fundraiser in the Last Year: Never true   [place] in the Last Year: Never true  Transportation Needs: No Transportation Needs   Lack of Transportation (Medical): No   Lack of Transportation (Non-Medical): No  Physical Activity: Insufficiently Active   Days of Exercise per Week: 1 day   Minutes of Exercise per Session: 10 min  Stress: No Stress Concern Present   Feeling of Stress : Not at all  Social Connections: Moderately Integrated   Frequency of Communication with Friends and Family: More than three times a week   Frequency of Social Gatherings with Friends and Family: Once a week   Attends  Religious Services: 1 to 4 times per year   Active Member of Clubs or Organizations: No   Attends Archivist Meetings: Not on file   Marital Status: Married  Human resources officer Violence: Not on file    Outpatient Medications Prior to Visit  Medication Sig Dispense Refill   amLODipine (NORVASC) 10 MG tablet TAKE 1 TABLET BY MOUTH EVERY DAY 30 tablet 2   hydrochlorothiazide (HYDRODIURIL) 25 MG tablet TAKE 1 TABLET BY MOUTH EVERY DAY 30 tablet 0   ibuprofen (ADVIL) 100 MG/5ML suspension Take 200 mg by mouth every 4 (four) hours as needed.     lisinopril (ZESTRIL) 40 MG tablet TAKE 1 TABLET BY MOUTH EVERY DAY 30 tablet 4   metoprolol succinate (TOPROL-XL) 50 MG 24 hr tablet TAKE 1 TABLET BY MOUTH DAILY IMMEDIATELY FOLLOWING A MEAL 30 tablet 0   Multiple Vitamin  (MULTIVITAMIN) tablet Take 1 tablet by mouth daily.     OVER THE COUNTER MEDICATION Co Q 10, 100 mg, daily.     potassium chloride (KLOR-CON 10) 10 MEQ tablet Take 1 tablet (10 mEq total) by mouth daily. 60 tablet 1   atorvastatin (LIPITOR) 20 MG tablet Take 1 tablet (20 mg total) by mouth at bedtime. 90 tablet 3   No facility-administered medications prior to visit.    No Known Allergies  ROS Review of Systems  Constitutional:  Negative for appetite change and fever.  Respiratory:  Negative for cough and shortness of breath.   Cardiovascular:  Negative for chest pain.  Gastrointestinal:  Negative for abdominal pain.  Endocrine: Negative for polydipsia.  Genitourinary:  Negative for dysuria.     Objective:    Physical Exam Vitals reviewed.  Constitutional:      Appearance: Normal appearance.  Cardiovascular:     Rate and Rhythm: Normal rate and regular rhythm.  Pulmonary:     Effort: Pulmonary effort is normal.     Breath sounds: Normal breath sounds.  Neurological:     Mental Status: He is alert.    BP 130/80 (BP Location: Left Arm, Patient Position: Sitting, Cuff Size: Normal)   Pulse 62   Temp 97.7 F (36.5 C) (Oral)   Wt 200 lb 12.8 oz (91.1 kg)   SpO2 98%   BMI 30.53 kg/m  Wt Readings from Last 3 Encounters:  01/11/21 200 lb 12.8 oz (91.1 kg)  12/21/20 206 lb 6.4 oz (93.6 kg)  12/06/20 210 lb 6.4 oz (95.4 kg)     Health Maintenance Due  Topic Date Due   HIV Screening  Never done   Zoster Vaccines- Shingrix (1 of 2) Never done   COVID-19 Vaccine (4 - Booster for Pfizer series) 03/22/2020    There are no preventive care reminders to display for this patient.  Lab Results  Component Value Date   TSH 4.25 12/21/2020   Lab Results  Component Value Date   WBC 8.0 12/21/2020   HGB 16.5 12/21/2020   HCT 47.6 12/21/2020   MCV 85.9 12/21/2020   PLT 269.0 12/21/2020   Lab Results  Component Value Date   NA 138 12/21/2020   K 3.0 (L) 12/21/2020    CO2 36 (H) 12/21/2020   GLUCOSE 232 (H) 12/21/2020   BUN 15 12/21/2020   CREATININE 0.94 12/21/2020   BILITOT 0.7 12/21/2020   ALKPHOS 78 12/21/2020   AST 16 12/21/2020   ALT 20 12/21/2020   PROT 7.3 12/21/2020   ALBUMIN 4.5 12/21/2020   CALCIUM 9.0 12/21/2020  GFR 85.55 12/21/2020   Lab Results  Component Value Date   CHOL 215 (H) 12/21/2020   Lab Results  Component Value Date   HDL 33.30 (L) 12/21/2020   Lab Results  Component Value Date   LDLCALC 116 (H) 08/07/2014   Lab Results  Component Value Date   TRIG 321.0 (H) 12/21/2020   Lab Results  Component Value Date   CHOLHDL 6 12/21/2020   Lab Results  Component Value Date   HGBA1C 9.0 (A) 01/11/2021      Assessment & Plan:   #1 hyperglycemia by recent labs.  A1c today 9.0%.  Patient has made some positive lifestyle changes with dietary change but we recommended going ahead and starting metformin 500 mg twice daily.  We did discuss possible referral to diabetic educator but he declines at this time.  We strongly advocated 73-month follow-up to reassess A1c. We discussed diet and exercise at some length.  Try to establish more consistent exercise.  Continue weight loss efforts.  #2 hypokalemia.  Probably related to the HCTZ therapy.  Patient is on potassium replacement -Recheck basic metabolic panel today  #3 hyperlipidemia.  We discussed importance of statin use especially in diabetics.  Possible intolerance previously with Lipitor and simvastatin.  We recommend a trial of Crestor 10 mg p.o. daily and reassess lipid and hepatic panel at follow-up   Meds ordered this encounter  Medications   rosuvastatin (CRESTOR) 10 MG tablet    Sig: Take 1 tablet (10 mg total) by mouth daily.    Dispense:  30 tablet    Refill:  5   metFORMIN (GLUCOPHAGE) 500 MG tablet    Sig: Take 1 tablet (500 mg total) by mouth 2 (two) times daily with a meal.    Dispense:  60 tablet    Refill:  3    Follow-up: Return in about 3  months (around 04/13/2021).    Carolann Littler, MD

## 2021-01-12 ENCOUNTER — Other Ambulatory Visit: Payer: Self-pay

## 2021-01-12 DIAGNOSIS — E876 Hypokalemia: Secondary | ICD-10-CM

## 2021-01-26 ENCOUNTER — Other Ambulatory Visit (INDEPENDENT_AMBULATORY_CARE_PROVIDER_SITE_OTHER): Payer: BC Managed Care – PPO

## 2021-01-26 DIAGNOSIS — E876 Hypokalemia: Secondary | ICD-10-CM

## 2021-01-26 LAB — BASIC METABOLIC PANEL
BUN: 17 mg/dL (ref 6–23)
CO2: 35 mEq/L — ABNORMAL HIGH (ref 19–32)
Calcium: 9.5 mg/dL (ref 8.4–10.5)
Chloride: 95 mEq/L — ABNORMAL LOW (ref 96–112)
Creatinine, Ser: 0.97 mg/dL (ref 0.40–1.50)
GFR: 82.32 mL/min (ref >60.00)
Glucose, Bld: 128 mg/dL — ABNORMAL HIGH (ref 70–99)
Potassium: 3 mEq/L — ABNORMAL LOW (ref 3.5–5.1)
Sodium: 137 mEq/L (ref 135–145)

## 2021-01-27 ENCOUNTER — Telehealth: Payer: Self-pay

## 2021-01-27 DIAGNOSIS — E876 Hypokalemia: Secondary | ICD-10-CM

## 2021-01-27 MED ORDER — POTASSIUM CHLORIDE ER 10 MEQ PO TBCR: 10.0000 meq | EXTENDED_RELEASE_TABLET | Freq: Two times a day (BID) | ORAL | 1 refills | Status: DC

## 2021-01-27 NOTE — Telephone Encounter (Signed)
Patient called stating the pharmacy  needs a new Rx due to the dose change patient stated he has enough to last through tomorrow  potassium chloride (KLOR-CON 10) 10 MEQ tablet

## 2021-01-27 NOTE — Telephone Encounter (Signed)
Rx sent in for 2 tablets a day per last lab result note. Spoke with the patient and he is aware.

## 2021-01-27 NOTE — Addendum Note (Signed)
Addended by: Rebecca Eaton on: 01/27/2021 10:57 AM   Modules accepted: Orders

## 2021-02-07 ENCOUNTER — Other Ambulatory Visit: Payer: Self-pay | Admitting: Family Medicine

## 2021-02-08 ENCOUNTER — Other Ambulatory Visit: Payer: Self-pay | Admitting: Family Medicine

## 2021-02-22 ENCOUNTER — Other Ambulatory Visit (INDEPENDENT_AMBULATORY_CARE_PROVIDER_SITE_OTHER): Payer: BC Managed Care – PPO

## 2021-02-22 DIAGNOSIS — E876 Hypokalemia: Secondary | ICD-10-CM

## 2021-02-22 LAB — BASIC METABOLIC PANEL
BUN: 17 mg/dL (ref 6–23)
CO2: 34 mEq/L — ABNORMAL HIGH (ref 19–32)
Calcium: 9.2 mg/dL (ref 8.4–10.5)
Chloride: 96 mEq/L (ref 96–112)
Creatinine, Ser: 0.94 mg/dL (ref 0.40–1.50)
GFR: 85.44 mL/min (ref >60.00)
Glucose, Bld: 145 mg/dL — ABNORMAL HIGH (ref 70–99)
Potassium: 3.2 mEq/L — ABNORMAL LOW (ref 3.5–5.1)
Sodium: 139 mEq/L (ref 135–145)

## 2021-02-28 ENCOUNTER — Telehealth: Payer: Self-pay | Admitting: Family Medicine

## 2021-02-28 MED ORDER — POTASSIUM CHLORIDE ER 10 MEQ PO TBCR: 10.0000 meq | EXTENDED_RELEASE_TABLET | Freq: Two times a day (BID) | ORAL | 1 refills | Status: DC

## 2021-02-28 NOTE — Telephone Encounter (Signed)
Pt is calling and still waiting on new rx for potassium chloride (KLOR-CON 10) 20 MEQ tablet twice daily  [id number] - [place], [place] - [address] Phone:  [phone]  Fax:  [phone]

## 2021-02-28 NOTE — Telephone Encounter (Signed)
Rx sent in

## 2021-03-02 NOTE — Telephone Encounter (Signed)
Patient called with questions about his prescription. Patient was under impression that medication will be switched to 66meq twice a day instead of the 16meq twice a day. Patient went ahead and picked up prescription but wanted callback to discuss.    Good callback number is [phone]     Please advise

## 2021-03-02 NOTE — Telephone Encounter (Signed)
Which medication is he needing clarification on?

## 2021-03-03 MED ORDER — POTASSIUM CHLORIDE CRYS ER 20 MEQ PO TBCR: 20.0000 meq | EXTENDED_RELEASE_TABLET | Freq: Two times a day (BID) | ORAL | 0 refills | Status: DC

## 2021-03-03 NOTE — Telephone Encounter (Signed)
Per lab results from 02/22/2021. Potassium was supposed to be increased to 85meq bid if the patient was taking the 33meq consistently. Confirmed the patient has been taken the 52meq as instructed. New Rx for 75meq has been sent in. Spoke with the patient and he is aware.

## 2021-03-03 NOTE — Telephone Encounter (Signed)
Apologies, the potassium chloride (KLOR-CON 10) 10 MEQ tablet      Please advise

## 2021-03-03 NOTE — Addendum Note (Signed)
Addended by: Rebecca Eaton on: 03/03/2021 10:31 AM   Modules accepted: Orders

## 2021-03-07 ENCOUNTER — Other Ambulatory Visit: Payer: Self-pay | Admitting: Family Medicine

## 2021-03-14 ENCOUNTER — Other Ambulatory Visit: Payer: Self-pay | Admitting: Family Medicine

## 2021-03-28 ENCOUNTER — Other Ambulatory Visit: Payer: Self-pay

## 2021-03-28 DIAGNOSIS — G4733 Obstructive sleep apnea (adult) (pediatric): Secondary | ICD-10-CM

## 2021-03-29 ENCOUNTER — Other Ambulatory Visit (INDEPENDENT_AMBULATORY_CARE_PROVIDER_SITE_OTHER): Payer: Medicare Other

## 2021-03-29 ENCOUNTER — Other Ambulatory Visit: Payer: Self-pay

## 2021-03-29 DIAGNOSIS — E876 Hypokalemia: Secondary | ICD-10-CM | POA: Diagnosis not present

## 2021-03-29 LAB — BASIC METABOLIC PANEL
BUN: 16 mg/dL (ref 6–23)
CO2: 39 mEq/L — ABNORMAL HIGH (ref 19–32)
Calcium: 9.3 mg/dL (ref 8.4–10.5)
Chloride: 95 mEq/L — ABNORMAL LOW (ref 96–112)
Creatinine, Ser: 0.93 mg/dL (ref 0.40–1.50)
GFR: 86.49 mL/min (ref >60.00)
Glucose, Bld: 143 mg/dL — ABNORMAL HIGH (ref 70–99)
Potassium: 3.3 mEq/L — ABNORMAL LOW (ref 3.5–5.1)
Sodium: 138 mEq/L (ref 135–145)

## 2021-04-04 ENCOUNTER — Ambulatory Visit: Payer: Medicare Other

## 2021-04-04 ENCOUNTER — Other Ambulatory Visit: Payer: Self-pay

## 2021-04-04 DIAGNOSIS — G4733 Obstructive sleep apnea (adult) (pediatric): Secondary | ICD-10-CM | POA: Diagnosis not present

## 2021-04-05 ENCOUNTER — Other Ambulatory Visit: Payer: Self-pay | Admitting: Family Medicine

## 2021-04-05 ENCOUNTER — Telehealth: Payer: Self-pay

## 2021-04-05 DIAGNOSIS — I10 Essential (primary) hypertension: Secondary | ICD-10-CM

## 2021-04-05 NOTE — Telephone Encounter (Signed)
complete

## 2021-04-08 DIAGNOSIS — G4733 Obstructive sleep apnea (adult) (pediatric): Secondary | ICD-10-CM | POA: Diagnosis not present

## 2021-04-08 NOTE — Progress Notes (Signed)
12/06/20- 65 yoM never smoker for sleep evaluation with concern of OSA courtesy of Dr Elease Hashimoto Medical problem list includes HTN, OSA, Obesity, Hyperlipidemia, Metabolic Syndrome, NPSG 3/57/01- AHI 19.7/ hr, desaturation to 85%, body weight 200 lbs, CPAP to 9 Epworth score-7 Body weight today-210 lbs Covid vax- Flu vax- Never started Rx after meeting once with Dr Gwenette Greet. ------Pt. Wants to talk about his lack of sleep.  Told to lose weight after NPSG in 2012. No other therapy offered. Dentist didd home sleep study several years ago and made oral appliance which never fit well and was abandoned.  No ENT surgery, heart or lung disease. No famhx OSA. 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  He is bothered by frequent waking with need to urinate. 1-2 naps/ day after meals, since retired. He and wife in separate bedrooms. She thinks he snores. No significant movement disturbance or complex parasomnia. No sleep meds. May drink iced tea into evening.   04/11/21- 66 yoM never smoker followed for OSA, complicated by HTN, OSA, Obesity, Hyperlipidemia, Metabolic Syndrome, DM2,  XBL-3/90/30- AHI 28.4/ hr, desaturation to 76%/median 88%, body weight  Body weight today-210 lbs Covid vax-3 Phizer Flu vax-no -----Patient had HST done and would like to go over results.  Sleep study reviewed- moderately severe OSA. Discussed management options. We will start CPAP autopap 5-20. Comfort and compliance goals discussed. Sleep disturbed by nocturia-hopefully this will improve on CPAP.   ROS-see HPI   + = positive Constitutional:    weight loss, night sweats, fevers, chills, fatigue, lassitude. HEENT:    headaches, difficulty swallowing, tooth/dental problems, sore throat,       sneezing, itching, ear ache, nasal congestion, post nasal drip, snoring CV:    chest pain, orthopnea, PND, swelling in lower extremities, anasarca,                                    dizziness, palpitations Resp:   shortness of breath with exertion or at rest.                productive cough,   non-productive cough, coughing up of blood.              change in color of mucus.  wheezing.   Skin:    rash or lesions. GI:  No-   heartburn, indigestion, abdominal pain, nausea, vomiting, diarrhea,                 change in bowel habits, loss of appetite GU: dysuria, change in color of urine, no urgency or frequency.   flank pain. MS:   joint pain, stiffness, decreased range of motion, back pain. Neuro-     nothing unusual Psych:  change in mood or affect.  depression or anxiety.   memory loss.  OBJ- Physical Exam General- Alert, Oriented, Affect-appropriate, Distress- none acute Skin- rash-none, lesions- none, excoriation- none Lymphadenopathy- none Head- atraumatic            Eyes- Gross vision intact, PERRLA, conjunctivae and secretions clear            Ears- Hearing, canals-normal            Nose- Clear, no-Septal dev, mucus, polyps, erosion, perforation             Throat- Mallampati IV , mucosa clear , drainage- none, tonsils- atrophic, +  teeth Neck- flexible , trachea midline, no stridor , thyroid nl, carotid no bruit Chest - symmetrical excursion , unlabored           Heart/CV- RRR , no murmur , no gallop  , no rub, nl s1 s2                           - JVD- none , edema- none, stasis changes- none, varices- none           Lung- clear to P&A, wheeze- none, cough- none , dullness-none, rub- none           Chest wall-  Abd-  Br/ Gen/ Rectal- Not done, not indicated Extrem- cyanosis- none, clubbing, none, atrophy- none, strength- nl Neuro- grossly intact to observation

## 2021-04-10 ENCOUNTER — Other Ambulatory Visit: Payer: Self-pay | Admitting: Family Medicine

## 2021-04-11 ENCOUNTER — Ambulatory Visit: Payer: Medicare Other | Admitting: Internal Medicine

## 2021-04-11 ENCOUNTER — Encounter: Payer: Self-pay | Admitting: Internal Medicine

## 2021-04-11 ENCOUNTER — Other Ambulatory Visit: Payer: Self-pay | Admitting: Family Medicine

## 2021-04-11 ENCOUNTER — Other Ambulatory Visit: Payer: Self-pay

## 2021-04-11 VITALS — BP 134/62 | HR 80 | Temp 97.7°F | Ht 68.5 in | Wt 202.2 lb

## 2021-04-11 DIAGNOSIS — G4733 Obstructive sleep apnea (adult) (pediatric): Secondary | ICD-10-CM

## 2021-04-11 DIAGNOSIS — E669 Obesity, unspecified: Secondary | ICD-10-CM | POA: Diagnosis not present

## 2021-04-11 NOTE — Telephone Encounter (Signed)
Patient need to schedule a diabetic follow up for more refills.

## 2021-04-11 NOTE — Patient Instructions (Signed)
Order- new DME, new CPAP auto 5-20, mask of choice, humidifier, supplies, [facility]/ card  Please call if we can help

## 2021-04-13 ENCOUNTER — Other Ambulatory Visit: Payer: Self-pay

## 2021-04-13 DIAGNOSIS — I10 Essential (primary) hypertension: Secondary | ICD-10-CM

## 2021-04-13 MED ORDER — METOPROLOL SUCCINATE ER 50 MG PO TB24: ORAL_TABLET | ORAL | 2 refills | Status: DC

## 2021-04-13 MED ORDER — HYDROCHLOROTHIAZIDE 25 MG PO TABS: 25.0000 mg | ORAL_TABLET | Freq: Every day | ORAL | 2 refills | Status: DC

## 2021-04-13 MED ORDER — AMLODIPINE BESYLATE 10 MG PO TABS: 10.0000 mg | ORAL_TABLET | Freq: Every day | ORAL | 2 refills | Status: DC

## 2021-04-20 ENCOUNTER — Ambulatory Visit (INDEPENDENT_AMBULATORY_CARE_PROVIDER_SITE_OTHER): Payer: Medicare Other | Admitting: Family Medicine

## 2021-04-20 ENCOUNTER — Encounter: Payer: Self-pay | Admitting: Family Medicine

## 2021-04-20 VITALS — BP 132/84 | HR 86 | Temp 98.5°F | Resp 16 | Ht 68.0 in | Wt 198.6 lb

## 2021-04-20 DIAGNOSIS — I10 Essential (primary) hypertension: Secondary | ICD-10-CM | POA: Diagnosis not present

## 2021-04-20 DIAGNOSIS — E785 Hyperlipidemia, unspecified: Secondary | ICD-10-CM | POA: Diagnosis not present

## 2021-04-20 DIAGNOSIS — E1165 Type 2 diabetes mellitus with hyperglycemia: Secondary | ICD-10-CM | POA: Diagnosis not present

## 2021-04-20 LAB — HEPATIC FUNCTION PANEL
ALT: 15 U/L (ref 0–53)
AST: 17 U/L (ref 0–37)
Albumin: 4.6 g/dL (ref 3.5–5.2)
Alkaline Phosphatase: 58 U/L (ref 39–117)
Bilirubin, Direct: 0.1 mg/dL (ref 0.0–0.3)
Total Bilirubin: 0.6 mg/dL (ref 0.2–1.2)
Total Protein: 7.7 g/dL (ref 6.0–8.3)

## 2021-04-20 LAB — BASIC METABOLIC PANEL
BUN: 17 mg/dL (ref 6–23)
CO2: 34 mEq/L — ABNORMAL HIGH (ref 19–32)
Calcium: 9.4 mg/dL (ref 8.4–10.5)
Chloride: 95 mEq/L — ABNORMAL LOW (ref 96–112)
Creatinine, Ser: 1.09 mg/dL (ref 0.40–1.50)
GFR: 71.46 mL/min (ref >60.00)
Glucose, Bld: 140 mg/dL — ABNORMAL HIGH (ref 70–99)
Potassium: 3.8 mEq/L (ref 3.5–5.1)
Sodium: 138 mEq/L (ref 135–145)

## 2021-04-20 LAB — LIPID PANEL
Cholesterol: 131 mg/dL (ref 0–200)
HDL: 36.3 mg/dL — ABNORMAL LOW (ref >39.00)
NonHDL: 94.56
Total CHOL/HDL Ratio: 4
Triglycerides: 232 mg/dL — ABNORMAL HIGH (ref 0.0–149.0)
VLDL: 46.4 mg/dL — ABNORMAL HIGH (ref 0.0–40.0)

## 2021-04-20 LAB — LDL CHOLESTEROL, DIRECT: Direct LDL: 69 mg/dL

## 2021-04-20 MED ORDER — METFORMIN HCL ER 750 MG PO TB24: 750.0000 mg | ORAL_TABLET | Freq: Every day | ORAL | 11 refills | Status: DC

## 2021-04-20 MED ORDER — POTASSIUM CHLORIDE CRYS ER 20 MEQ PO TBCR: 20.0000 meq | EXTENDED_RELEASE_TABLET | Freq: Two times a day (BID) | ORAL | 11 refills | Status: DC

## 2021-04-20 NOTE — Patient Instructions (Signed)
A1C improved to 7.0!    ? ?Keep up the good work ? ?Let's check lipids today to make sure closer to goal.  ?

## 2021-04-20 NOTE — Assessment & Plan Note (Signed)
Appropriate discussion, questions answered. Treatment options reviewed. ?Plan- CPAP auto 5-20 ?

## 2021-04-20 NOTE — Progress Notes (Signed)
Established Patient Office Visit  Subjective:  Patient ID: Tyrone Jefferson, male    DOB: 11/27/1955  Age: 66 y.o. MRN: [id number]  CC:  Chief Complaint  Patient presents with   4 months follow up     Pt would like to discuss medication.    HPI Tyrone Jefferson presents for medical follow-up.  He has hypertension which has been challenging to control.  Currently takes amlodipine, HCTZ, metoprolol, and lisinopril.  Compliant with medications.  Recent home blood pressure mostly 027 systolic.  He does have obstructive sleep apnea followed by pulmonary for that and hopes to start CPAP soon.  Recent A1c 9.0%.  We started metformin 500 g twice daily.  He has had some increased cramping, gaseous symptoms and some loose stools.  He would like to consider switch to extended release.  He has made some positive dietary changes and has greatly reduced sugar intake.  He also recently joined [employer] and plans to start exercising more diligently.  We recently started Crestor for his lipids and he is tolerating well.  Needs follow-up labs.  Past Medical History:  Diagnosis Date   Chest pain    Dyspnea    Hyperlipidemia    Hypertension    Metabolic syndrome    OSA (obstructive sleep apnea)    Sleep apnea     Past Surgical History:  Procedure Laterality Date   BACK SURGERY  1992   SPINE SURGERY  1992   L4-5 discectomy    Family History  Problem Relation Age of Onset   Brain cancer Mother 47   Heart attack Mother 16   Hypertension Mother    Diabetes Mother    Heart disease Father 69   Colon cancer Father    Stroke Brother    Rectal cancer Neg Hx    Stomach cancer Neg Hx     Social History   Socioeconomic History   Marital status: Married    Spouse name: Pamala Hurry   Number of children: N   Years of education: Not on file   Highest education level: Bachelor's degree (e.g., BA, AB, BS)  Occupational History   Occupation: route sales  Tobacco Use   Smoking status: Never    Smokeless tobacco: Never  Vaping Use   Vaping Use: Never used  Substance and Sexual Activity   Alcohol use: No   Drug use: No   Sexual activity: Not on file  Other Topics Concern   Not on file  Social History Narrative   Not on file   Social Determinants of Health   Financial Resource Strain: Low Risk    Difficulty of Paying Living Expenses: Not hard at all  Food Insecurity: No Food Insecurity   Worried About Charity fundraiser in the Last Year: Never true   [place] in the Last Year: Never true  Transportation Needs: No Transportation Needs   Lack of Transportation (Medical): No   Lack of Transportation (Non-Medical): No  Physical Activity: Insufficiently Active   Days of Exercise per Week: 1 day   Minutes of Exercise per Session: 10 min  Stress: No Stress Concern Present   Feeling of Stress : Not at all  Social Connections: Moderately Integrated   Frequency of Communication with Friends and Family: More than three times a week   Frequency of Social Gatherings with Friends and Family: Once a week   Attends Religious Services: 1 to 4 times per year   Active Member of  Clubs or Organizations: No   Attends Music therapist: Not on file   Marital Status: Married  Human resources officer Violence: Not on file    Outpatient Medications Prior to Visit  Medication Sig Dispense Refill   amLODipine (NORVASC) 10 MG tablet Take 1 tablet (10 mg total) by mouth daily. 30 tablet 2   hydrochlorothiazide (HYDRODIURIL) 25 MG tablet Take 1 tablet (25 mg total) by mouth daily. 30 tablet 2   lisinopril (ZESTRIL) 40 MG tablet TAKE 1 TABLET BY MOUTH EVERY DAY 30 tablet 4   metoprolol succinate (TOPROL-XL) 50 MG 24 hr tablet TAKE 1 TABLET BY MOUTH DAILY IMMEDIATELY FOLLOWING A MEAL 30 tablet 2   Multiple Vitamin (MULTIVITAMIN) tablet Take 1 tablet by mouth daily.     rosuvastatin (CRESTOR) 10 MG tablet Take 1 tablet (10 mg total) by mouth daily. 30 tablet 5   metFORMIN  (GLUCOPHAGE) 500 MG tablet TAKE ONE TABLET BY MOUTH TWICE A DAY WITH A MEAL 60 tablet 0   potassium chloride SA (KLOR-CON M) 20 MEQ tablet Take 1 tablet (20 mEq total) by mouth 2 (two) times daily. 30 tablet 0   No facility-administered medications prior to visit.    No Known Allergies  ROS Review of Systems  Constitutional:  Negative for fatigue.  Eyes:  Negative for visual disturbance.  Respiratory:  Negative for cough, chest tightness and shortness of breath.   Cardiovascular:  Negative for chest pain, palpitations and leg swelling.  Endocrine: Negative for polydipsia and polyuria.  Neurological:  Negative for dizziness, syncope, weakness, light-headedness and headaches.     Objective:    Physical Exam Constitutional:      Appearance: He is well-developed.  HENT:     Right Ear: External ear normal.     Left Ear: External ear normal.  Eyes:     Pupils: Pupils are equal, round, and reactive to light.  Neck:     Thyroid: No thyromegaly.  Cardiovascular:     Rate and Rhythm: Normal rate and regular rhythm.  Pulmonary:     Effort: Pulmonary effort is normal. No respiratory distress.     Breath sounds: Normal breath sounds. No wheezing or rales.  Musculoskeletal:     Cervical back: Neck supple.  Neurological:     Mental Status: He is alert and oriented to person, place, and time.    BP 132/84    Pulse 86    Temp 98.5 F (36.9 C)    Resp 16    Ht 5\' 8"  (1.727 m)    Wt 198 lb 9.6 oz (90.1 kg)    SpO2 98%    BMI 30.20 kg/m  Wt Readings from Last 3 Encounters:  04/20/21 198 lb 9.6 oz (90.1 kg)  04/11/21 202 lb 3.2 oz (91.7 kg)  01/11/21 200 lb 12.8 oz (91.1 kg)     Health Maintenance Due  Topic Date Due   FOOT EXAM  Never done   OPHTHALMOLOGY EXAM  Never done   Zoster Vaccines- Shingrix (1 of 2) Never done   COVID-19 Vaccine (4 - Booster for Pfizer series) 03/22/2020   Pneumonia Vaccine 24+ Years old (1 - PCV) 04/05/2021    There are no preventive care reminders  to display for this patient.  Lab Results  Component Value Date   TSH 4.25 12/21/2020   Lab Results  Component Value Date   WBC 8.0 12/21/2020   HGB 16.5 12/21/2020   HCT 47.6 12/21/2020   MCV 85.9 12/21/2020  PLT 269.0 12/21/2020   Lab Results  Component Value Date   NA 138 03/29/2021   K 3.3 (L) 03/29/2021   CO2 39 (H) 03/29/2021   GLUCOSE 143 (H) 03/29/2021   BUN 16 03/29/2021   CREATININE 0.93 03/29/2021   BILITOT 0.7 12/21/2020   ALKPHOS 78 12/21/2020   AST 16 12/21/2020   ALT 20 12/21/2020   PROT 7.3 12/21/2020   ALBUMIN 4.5 12/21/2020   CALCIUM 9.3 03/29/2021   GFR 86.49 03/29/2021   Lab Results  Component Value Date   CHOL 215 (H) 12/21/2020   Lab Results  Component Value Date   HDL 33.30 (L) 12/21/2020   Lab Results  Component Value Date   LDLCALC 116 (H) 08/07/2014   Lab Results  Component Value Date   TRIG 321.0 (H) 12/21/2020   Lab Results  Component Value Date   CHOLHDL 6 12/21/2020   Lab Results  Component Value Date   HGBA1C 9.0 (A) 01/11/2021      Assessment & Plan:   #1 type 2 diabetes improved with A1c 7.0.  He is having some GI symptoms likely related to metformin.  We will change his metformin to metformin extended release 750 mg once daily.  Continue healthy lifestyle changes.  Might consider SGLT2 or GLP-1 medication down the road if necessary. -Recheck in 3 months  #2 dyslipidemia.  Recent initiation of Crestor.  Recheck fasting lipid and hepatic panel today  #3 hypertension improved.  We also explained that if he goes on CPAP to address his sleep apnea this might improve even further.  Continue weight loss efforts.  Recheck basic metabolic panel.  He had some recurrent hypokalemia.  Consider switch to potassium sparing diuretic if potassium remains low   Meds ordered this encounter  Medications   potassium chloride SA (KLOR-CON M) 20 MEQ tablet    Sig: Take 1 tablet (20 mEq total) by mouth 2 (two) times daily.     Dispense:  60 tablet    Refill:  11   metFORMIN (GLUCOPHAGE-XR) 750 MG 24 hr tablet    Sig: Take 1 tablet (750 mg total) by mouth daily with breakfast.    Dispense:  30 tablet    Refill:  11    Follow-up: Return in about 3 months (around 07/21/2021).    Carolann Littler, MD

## 2021-04-20 NOTE — Assessment & Plan Note (Signed)
If he can keep his weight down, that will improve control of DM as well as OSA. ?

## 2021-05-11 ENCOUNTER — Other Ambulatory Visit: Payer: Self-pay | Admitting: Family Medicine

## 2021-05-14 DIAGNOSIS — H524 Presbyopia: Secondary | ICD-10-CM | POA: Diagnosis not present

## 2021-05-14 DIAGNOSIS — E119 Type 2 diabetes mellitus without complications: Secondary | ICD-10-CM | POA: Diagnosis not present

## 2021-05-14 LAB — HM DIABETES EYE EXAM

## 2021-05-16 ENCOUNTER — Telehealth (INDEPENDENT_AMBULATORY_CARE_PROVIDER_SITE_OTHER): Payer: Medicare Other | Admitting: Family Medicine

## 2021-05-16 ENCOUNTER — Encounter: Payer: Self-pay | Admitting: Family Medicine

## 2021-05-16 VITALS — Temp 101.0°F | Ht 68.0 in | Wt 198.0 lb

## 2021-05-16 DIAGNOSIS — U071 COVID-19: Secondary | ICD-10-CM

## 2021-05-16 DIAGNOSIS — R051 Acute cough: Secondary | ICD-10-CM | POA: Diagnosis not present

## 2021-05-16 LAB — POCT INFLUENZA A/B
Influenza A, POC: NEGATIVE
Influenza B, POC: NEGATIVE

## 2021-05-16 LAB — POC COVID19 BINAXNOW: SARS Coronavirus 2 Ag: POSITIVE — AB

## 2021-05-16 MED ORDER — BENZONATATE 100 MG PO CAPS: 100.0000 mg | ORAL_CAPSULE | Freq: Two times a day (BID) | ORAL | 0 refills | Status: AC | PRN

## 2021-05-16 MED ORDER — HYDROCODONE BIT-HOMATROP MBR 5-1.5 MG/5ML PO SOLN: 5.0000 mL | Freq: Two times a day (BID) | ORAL | 0 refills | Status: AC | PRN

## 2021-05-16 MED ORDER — NIRMATRELVIR/RITONAVIR (PAXLOVID)TABLET: 3.0000 | ORAL_TABLET | Freq: Two times a day (BID) | ORAL | 0 refills | Status: AC

## 2021-05-16 NOTE — Progress Notes (Signed)
Virtual Visit via Video Note ?I connected with Tyrone Jefferson on 05/16/21 by a video enabled telemedicine application and verified that I am speaking with the correct person using two identifiers. ? Location patient: home ?Location provider:work office ?Persons participating in the virtual visit: patient, provider ? ?I discussed the limitations of evaluation and management by telemedicine and the availability of in person appointments. The patient expressed understanding and agreed to proceed. ? ?Chief Complaint  ?Patient presents with  ? Covid Positive  ? ?HPI: ?Tyrone Jefferson is a 66 year old male with history of hyperlipidemia, DM II, OSA, and hypertension c/o 2 days of respiratory symptoms. ?"Little" headache, fever of 101.4 F, fatigue, body aches, nasal congestion, rhinorrhea, sore throat, postnasal drainage, and nonproductive cough. ?Cough is interfering with his sleep, like a "tickle" in his throat. ?"Little" nausea. ?Negative for anosmia,ageusia,wheezing, stridor, dyspnea, palpitations, CP, abdominal pain, vomiting, changes in bowel habits, urinary symptoms, or a skin rash. ? ?He has taken Tylenol. ?COVID-19 vaccination completed. ? ?His wife has been sick with similar symptoms for 4-5 days. ? ?ROS: See pertinent positives and negatives per HPI. ? ?Past Medical History:  ?Diagnosis Date  ? Chest pain   ? Dyspnea   ? Hyperlipidemia   ? Hypertension   ? Metabolic syndrome   ? OSA (obstructive sleep apnea)   ? Sleep apnea   ? ? ?Past Surgical History:  ?Procedure Laterality Date  ? BACK SURGERY  1992  ? [place]  ? L4-5 discectomy  ? ? ?Family History  ?Problem Relation Age of Onset  ? Brain cancer Mother 72  ? Heart attack Mother 68  ? Hypertension Mother   ? Diabetes Mother   ? Heart disease Father 48  ? Colon cancer Father   ? Stroke Brother   ? Rectal cancer Neg Hx   ? Stomach cancer Neg Hx   ? ? ?Social History  ? ?Socioeconomic History  ? Marital status: Married  ?  Spouse name: Tyrone Jefferson  ?  Number of children: N  ? Years of education: Not on file  ? Highest education level: Bachelor's degree (e.g., BA, AB, BS)  ?Occupational History  ? Occupation: route sales  ?Tobacco Use  ? Smoking status: Never  ? Smokeless tobacco: Never  ?Vaping Use  ? Vaping Use: Never used  ?Substance and Sexual Activity  ? Alcohol use: No  ? Drug use: No  ? Sexual activity: Not on file  ?Other Topics Concern  ? Not on file  ?Social History Narrative  ? Not on file  ? ?Social Determinants of Health  ? ?Financial Resource Strain: Low Risk   ? Difficulty of Paying Living Expenses: Not hard at all  ?Food Insecurity: No Food Insecurity  ? Worried About Charity fundraiser in the Last Year: Never true  ? Ran Out of Food in the Last Year: Never true  ?Transportation Needs: No Transportation Needs  ? Lack of Transportation (Medical): No  ? Lack of Transportation (Non-Medical): No  ?Physical Activity: Insufficiently Active  ? Days of Exercise per Week: 1 day  ? Minutes of Exercise per Session: 10 min  ?Stress: No Stress Concern Present  ? Feeling of Stress : Not at all  ?Social Connections: Moderately Integrated  ? Frequency of Communication with Friends and Family: More than three times a week  ? Frequency of Social Gatherings with Friends and Family: Once a week  ? Attends Religious Services: 1 to 4 times per year  ? Active Member  of Clubs or Organizations: No  ? Attends Archivist Meetings: Not on file  ? Marital Status: Married  ?Intimate Partner Violence: Not on file  ? ? ? ? ?Current Outpatient Medications:  ?  amLODipine (NORVASC) 10 MG tablet, Take 1 tablet (10 mg total) by mouth daily., Disp: 30 tablet, Rfl: 2 ?  benzonatate (TESSALON) 100 MG capsule, Take 1 capsule (100 mg total) by mouth 2 (two) times daily as needed for up to 10 days., Disp: 20 capsule, Rfl: 0 ?  hydrochlorothiazide (HYDRODIURIL) 25 MG tablet, Take 1 tablet (25 mg total) by mouth daily., Disp: 30 tablet, Rfl: 2 ?  HYDROcodone bit-homatropine  (HYCODAN) 5-1.5 MG/5ML syrup, Take 5 mLs by mouth every 12 (twelve) hours as needed for up to 10 days for cough., Disp: 100 mL, Rfl: 0 ?  lisinopril (ZESTRIL) 40 MG tablet, TAKE ONE TABLET BY MOUTH DAILY, Disp: 30 tablet, Rfl: 4 ?  metFORMIN (GLUCOPHAGE-XR) 750 MG 24 hr tablet, Take 1 tablet (750 mg total) by mouth daily with breakfast., Disp: 30 tablet, Rfl: 11 ?  metoprolol succinate (TOPROL-XL) 50 MG 24 hr tablet, TAKE 1 TABLET BY MOUTH DAILY IMMEDIATELY FOLLOWING A MEAL, Disp: 30 tablet, Rfl: 2 ?  Multiple Vitamin (MULTIVITAMIN) tablet, Take 1 tablet by mouth daily., Disp: , Rfl:  ?  nirmatrelvir/ritonavir EUA (PAXLOVID) 20 x 150 MG & 10 x '100MG'$  TABS, Take 3 tablets by mouth 2 (two) times daily for 5 days. (Take nirmatrelvir 150 mg two tablets twice daily for 5 days and ritonavir 100 mg one tablet twice daily for 5 days) Patient GFR is 71, Disp: 30 tablet, Rfl: 0 ?  potassium chloride SA (KLOR-CON M) 20 MEQ tablet, Take 1 tablet (20 mEq total) by mouth 2 (two) times daily., Disp: 60 tablet, Rfl: 11 ?  rosuvastatin (CRESTOR) 10 MG tablet, Take 1 tablet (10 mg total) by mouth daily., Disp: 30 tablet, Rfl: 5 ? ?EXAM: ? ?VITALS per patient if applicable:Temp (!) 269 ?F (38.3 ?C) (Oral)   Ht '5\' 8"'$  (1.727 m)   Wt 198 lb (89.8 kg)   BMI 30.11 kg/m?  ? ?GENERAL: alert, oriented, appears well and in no acute distress ? ?HEENT: atraumatic, conjunctiva clear, no obvious abnormalities on inspection of external nose and ears ? ?NECK: normal movements of the head and neck ? ?LUNGS: on inspection no signs of respiratory distress, breathing rate appears normal, no obvious gross SOB, gasping or wheezing ? ?CV: no obvious cyanosis ? ?MS: moves all visible extremities without noticeable abnormality ? ?PSYCH/NEURO: pleasant and cooperative, no obvious depression or anxiety, speech and thought processing grossly intact ? ?ASSESSMENT AND PLAN: ? ?Discussed the following assessment and plan: ? ?COVID-19 virus infection - Plan: POC  COVID-19 BinaxNow, POCT Influenza A/B, nirmatrelvir/ritonavir EUA (PAXLOVID) 20 x 150 MG & 10 x '100MG'$  TABS. ?Rapid flu negative. ?COVID 19  test was positive. ? ?We discussed Dx,possible complications and treatment options. ?He has a mild to moderate case with risk for complications. ?We discussed oral antiviral options and side effects. ?He agrees with trying Paxlovid.Recommend holding on rosuvastatin while taking medication. ?Symptomatic treatment with plenty of fluids,rest,tylenol 500 mg 3-4 times per day prn. ?Throat lozenges if needed for sore throat. ?Instructed about warning signs. ? ?Acute cough - Plan: POC COVID-19 BinaxNow, POCT Influenza A/B, HYDROcodone bit-homatropine (HYCODAN) 5-1.5 MG/5ML syrup, benzonatate (TESSALON) 100 MG capsule ?Explained that cough and congestion may last a few more days and even weeks after acute symptoms have resolved. ?I do not  think imaging is needed. ?Benzonatate and Hycodan for symptomatic treatment, son side effect discussed. ? ?We discussed possible serious and likely etiologies, options for evaluation and workup, limitations of telemedicine visit vs in person visit, treatment, treatment risks and precautions. ?The patient was advised to call back or seek an in-person evaluation if the symptoms worsen or if the condition fails to improve as anticipated. ?I discussed the assessment and treatment plan with the patient. The patient was provided an opportunity to ask questions and all were answered. The patient agreed with the plan and demonstrated an understanding of the instructions. ? ?Return if symptoms worsen or fail to improve. ? ?Sahaj Bona [place], MD  ? ?

## 2021-05-27 ENCOUNTER — Other Ambulatory Visit: Payer: Self-pay | Admitting: Family Medicine

## 2021-06-01 DIAGNOSIS — G4733 Obstructive sleep apnea (adult) (pediatric): Secondary | ICD-10-CM | POA: Diagnosis not present

## 2021-06-06 ENCOUNTER — Encounter: Payer: Self-pay | Admitting: Family Medicine

## 2021-07-01 DIAGNOSIS — G4733 Obstructive sleep apnea (adult) (pediatric): Secondary | ICD-10-CM | POA: Diagnosis not present

## 2021-07-09 ENCOUNTER — Other Ambulatory Visit: Payer: Self-pay | Admitting: Family Medicine

## 2021-07-09 DIAGNOSIS — I10 Essential (primary) hypertension: Secondary | ICD-10-CM

## 2021-07-11 ENCOUNTER — Ambulatory Visit: Payer: Medicare Other | Admitting: Internal Medicine

## 2021-07-16 NOTE — Progress Notes (Unsigned)
HPI- M never smoker followed for OSA, complicated by HTN, OSA, Obesity, Hyperlipidemia, Metabolic Syndrome, DM2,  NPSG 05/03/10- AHI 19.7/ hr, desaturation to 85%, body weight 200 lbs, CPAP to 9 HST-04/04/21- AHI 28.4/ hr, desaturation to 76%/median 88%, body weight   ===============================================================================   04/11/21- 30 yoM never smoker followed for OSA, complicated by HTN, OSA, Obesity, Hyperlipidemia, Metabolic Syndrome, DM2,  TKZ-07/22/07- AHI 28.4/ hr, desaturation to 76%/median 88%, body weight  Body weight today-210 lbs Covid vax-3 Phizer Flu vax-no -----Patient had HST done and would like to go over results.  Sleep study reviewed- moderately severe OSA. Discussed management options. We will start CPAP autopap 5-20. Comfort and compliance goals discussed. Sleep disturbed by nocturia-hopefully this will improve on CPAP.  07/19/21- 87 yoM never smoker followed for OSA, complicated by HTN, OSA, Obesity, Hyperlipidemia, Metabolic Syndrome, DM2,  CPAP auto 5-20/ [employer]     ordered 04/11/21 Download compliance- 80%, AHI 2.3/ hr Body weight today Covid vax-3 Phizer Flu vax-no -----Patient states that he has not been able to find a mask that works for him and is comfortable.    ROS-see HPI   + = positive Constitutional:    weight loss, night sweats, fevers, chills, fatigue, lassitude. HEENT:    headaches, difficulty swallowing, tooth/dental problems, sore throat,       sneezing, itching, ear ache, nasal congestion, post nasal drip, snoring CV:    chest pain, orthopnea, PND, swelling in lower extremities, anasarca,                                   dizziness, palpitations Resp:   shortness of breath with exertion or at rest.                productive cough,   non-productive cough, coughing up of blood.              change in color of mucus.  wheezing.   Skin:    rash or lesions. GI:  No-   heartburn, indigestion, abdominal pain, nausea, vomiting,  diarrhea,                 change in bowel habits, loss of appetite GU: dysuria, change in color of urine, no urgency or frequency.   flank pain. MS:   joint pain, stiffness, decreased range of motion, back pain. Neuro-     nothing unusual Psych:  change in mood or affect.  depression or anxiety.   memory loss.  OBJ- Physical Exam General- Alert, Oriented, Affect-appropriate, Distress- none acute Skin- rash-none, lesions- none, excoriation- none Lymphadenopathy- none Head- atraumatic            Eyes- Gross vision intact, PERRLA, conjunctivae and secretions clear            Ears- Hearing, canals-normal            Nose- Clear, no-Septal dev, mucus, polyps, erosion, perforation             Throat- Mallampati IV , mucosa clear , drainage- none, tonsils- atrophic, + teeth Neck- flexible , trachea midline, no stridor , thyroid nl, carotid no bruit Chest - symmetrical excursion , unlabored           Heart/CV- RRR , no murmur , no gallop  , no rub, nl s1 s2                           -  JVD- none , edema- none, stasis changes- none, varices- none           Lung- clear to P&A, wheeze- none, cough- none , dullness-none, rub- none           Chest wall-  Abd-  Br/ Gen/ Rectal- Not done, not indicated Extrem- cyanosis- none, clubbing, none, atrophy- none, strength- nl Neuro- grossly intact to observation

## 2021-07-18 ENCOUNTER — Encounter: Payer: Self-pay | Admitting: Internal Medicine

## 2021-07-19 ENCOUNTER — Ambulatory Visit (INDEPENDENT_AMBULATORY_CARE_PROVIDER_SITE_OTHER): Payer: Medicare Other | Admitting: Family Medicine

## 2021-07-19 ENCOUNTER — Ambulatory Visit: Payer: Medicare Other | Admitting: Internal Medicine

## 2021-07-19 ENCOUNTER — Encounter: Payer: Self-pay | Admitting: Internal Medicine

## 2021-07-19 ENCOUNTER — Encounter: Payer: Self-pay | Admitting: Family Medicine

## 2021-07-19 VITALS — BP 122/70 | HR 71 | Temp 97.7°F | Ht 68.0 in | Wt 201.0 lb

## 2021-07-19 VITALS — BP 132/70 | HR 63 | Temp 97.7°F | Ht 68.0 in | Wt 195.4 lb

## 2021-07-19 DIAGNOSIS — G4733 Obstructive sleep apnea (adult) (pediatric): Secondary | ICD-10-CM

## 2021-07-19 DIAGNOSIS — I10 Essential (primary) hypertension: Secondary | ICD-10-CM | POA: Diagnosis not present

## 2021-07-19 DIAGNOSIS — E1165 Type 2 diabetes mellitus with hyperglycemia: Secondary | ICD-10-CM | POA: Diagnosis not present

## 2021-07-19 DIAGNOSIS — F5101 Primary insomnia: Secondary | ICD-10-CM | POA: Diagnosis not present

## 2021-07-19 DIAGNOSIS — E785 Hyperlipidemia, unspecified: Secondary | ICD-10-CM | POA: Diagnosis not present

## 2021-07-19 LAB — POCT GLYCOSYLATED HEMOGLOBIN (HGB A1C): Hemoglobin A1C: 6.9 % — AB (ref 4.0–5.6)

## 2021-07-19 MED ORDER — TEMAZEPAM 15 MG PO CAPS: 15.0000 mg | ORAL_CAPSULE | Freq: Every evening | ORAL | 1 refills | Status: DC | PRN

## 2021-07-19 NOTE — Patient Instructions (Signed)
Order- referral for mask fiting  Script sent to try temazepam 15 mg at bedtime for sleep as needed  Please call if we can help

## 2021-07-19 NOTE — Progress Notes (Signed)
Established Patient Office Visit  Subjective   Patient ID: Tyrone Jefferson, male    DOB: 1955-08-14  Age: 66 y.o. MRN: [id number]  Chief Complaint  Patient presents with   Follow-up    HPI   Here is seen for medical follow-up.  He has history of type 2 diabetes, hyperlipidemia, obstructive sleep apnea, hypertension.  Several months ago had A1c 9.0.  We started immediate release metformin but he had GI side effects.  We transition over to metformin extended release 750 mg once daily and he is tolerating well.  No diarrhea.  No abdominal cramping.  Not monitoring sugars regularly.  He has tried to make some dietary efforts with restricting sugars.  Using sugar substitute.  No alcohol.  Weight is down a few pounds.  He has had resistant hypertension currently improved on amlodipine, HCTZ, metoprolol, and lisinopril.  Recent diagnosis of obstructive sleep apnea.  Has been trying various facemask for his CPAP.  Has follow-up with pulmonary later today.  Lipids were improved last visit on Crestor.  Tolerating well with no myalgias.  Past Medical History:  Diagnosis Date   Chest pain    Dyspnea    Hyperlipidemia    Hypertension    Metabolic syndrome    OSA (obstructive sleep apnea)    Sleep apnea    Past Surgical History:  Procedure Laterality Date   BACK SURGERY  1992   SPINE SURGERY  1992   L4-5 discectomy    reports that he has never smoked. He has never used smokeless tobacco. He reports that he does not drink alcohol and does not use drugs. family history includes Brain cancer (age of onset: 73) in his mother; Colon cancer in his father; Diabetes in his mother; Heart attack (age of onset: 76) in his mother; Heart disease (age of onset: 4) in his father; Hypertension in his mother; Stroke in his brother. No Known Allergies  Review of Systems  Constitutional:  Negative for chills and fever.  Respiratory:  Negative for shortness of breath.   Cardiovascular:  Negative for  chest pain.  Gastrointestinal:  Negative for abdominal pain and diarrhea.     Objective:     BP 132/70 (BP Location: Left Arm, Patient Position: Sitting, Cuff Size: Normal)   Pulse 63   Temp 97.7 F (36.5 C) (Oral)   Ht '5\' 8"'$  (1.727 m)   Wt 195 lb 6.4 oz (88.6 kg)   SpO2 99%   BMI 29.71 kg/m  BP Readings from Last 3 Encounters:  07/19/21 132/70  04/20/21 132/84  04/11/21 134/62   Wt Readings from Last 3 Encounters:  07/19/21 195 lb 6.4 oz (88.6 kg)  05/16/21 198 lb (89.8 kg)  04/20/21 198 lb 9.6 oz (90.1 kg)      Physical Exam Vitals reviewed.  Constitutional:      Appearance: Normal appearance.  Cardiovascular:     Rate and Rhythm: Normal rate and regular rhythm.  Pulmonary:     Effort: Pulmonary effort is normal.     Breath sounds: Normal breath sounds.  Musculoskeletal:     Right lower leg: No edema.     Left lower leg: No edema.  Neurological:     Mental Status: He is alert.     Results for orders placed or performed in visit on 07/19/21  POCT glycosylated hemoglobin (Hb A1C)  Result Value Ref Range   Hemoglobin A1C 6.9 (A) 4.0 - 5.6 %   HbA1c POC (<> result, manual entry)  HbA1c, POC (prediabetic range)     HbA1c, POC (controlled diabetic range)      Last metabolic panel Lab Results  Component Value Date   GLUCOSE 140 (H) 04/20/2021   NA 138 04/20/2021   K 3.8 04/20/2021   CL 95 (L) 04/20/2021   CO2 34 (H) 04/20/2021   BUN 17 04/20/2021   CREATININE 1.09 04/20/2021   CALCIUM 9.4 04/20/2021   PROT 7.7 04/20/2021   ALBUMIN 4.6 04/20/2021   BILITOT 0.6 04/20/2021   ALKPHOS 58 04/20/2021   AST 17 04/20/2021   ALT 15 04/20/2021   Last lipids Lab Results  Component Value Date   CHOL 131 04/20/2021   HDL 36.30 (L) 04/20/2021   LDLCALC 116 (H) 08/07/2014   LDLDIRECT 69.0 04/20/2021   TRIG 232.0 (H) 04/20/2021   CHOLHDL 4 04/20/2021   Last hemoglobin A1c Lab Results  Component Value Date   HGBA1C 6.9 (A) 07/19/2021      The  10-year ASCVD risk score (Arnett DK, et al., 2019) is: 25.2%    Assessment & Plan:   #1 type 2 diabetes greatly improved with A1c today 6.9%.  Continue metformin extended release 750 mg once daily.  He would like to give this 3 more months of lifestyle modification and then reassess.  We did briefly discuss other type 2 diabetes medications such as SGLT2 and GLP-1 class but he prefers to do this with less medication if possible  #2 hypertension improved and stable.  Continue current medications above.  Continue weight loss efforts. Hopefully addressing his obstructive sleep apnea will help with his blood pressure control as well  #3 hyperlipidemia treated with Crestor 10 mg daily and tolerating well.  Recent lipids reviewed.   Return in about 3 months (around 10/19/2021).    Carolann Littler, MD

## 2021-07-22 ENCOUNTER — Ambulatory Visit: Payer: Medicare Other | Admitting: Family Medicine

## 2021-08-01 DIAGNOSIS — G4733 Obstructive sleep apnea (adult) (pediatric): Secondary | ICD-10-CM | POA: Diagnosis not present

## 2021-08-14 ENCOUNTER — Encounter: Payer: Self-pay | Admitting: Internal Medicine

## 2021-08-14 DIAGNOSIS — G4700 Insomnia, unspecified: Secondary | ICD-10-CM | POA: Insufficient documentation

## 2021-08-14 NOTE — Assessment & Plan Note (Signed)
Benefits from CPAP but needs help with comfort. Plan- mask fitting

## 2021-08-25 ENCOUNTER — Ambulatory Visit (INDEPENDENT_AMBULATORY_CARE_PROVIDER_SITE_OTHER): Payer: Medicare Other | Admitting: Family Medicine

## 2021-08-25 ENCOUNTER — Encounter: Payer: Self-pay | Admitting: Family Medicine

## 2021-08-25 VITALS — BP 150/76 | HR 66 | Temp 97.8°F | Wt 199.2 lb

## 2021-08-25 DIAGNOSIS — I10 Essential (primary) hypertension: Secondary | ICD-10-CM

## 2021-08-25 DIAGNOSIS — Z9989 Dependence on other enabling machines and devices: Secondary | ICD-10-CM

## 2021-08-25 DIAGNOSIS — G4733 Obstructive sleep apnea (adult) (pediatric): Secondary | ICD-10-CM

## 2021-08-25 DIAGNOSIS — L03211 Cellulitis of face: Secondary | ICD-10-CM

## 2021-08-25 MED ORDER — SULFAMETHOXAZOLE-TRIMETHOPRIM 800-160 MG PO TABS: 1.0000 | ORAL_TABLET | Freq: Two times a day (BID) | ORAL | 0 refills | Status: AC

## 2021-08-25 NOTE — Progress Notes (Signed)
Subjective:    Patient ID: Tyrone Jefferson, male    DOB: Jan 26, 1956, 66 y.o.   MRN: [id number]  Chief Complaint  Patient presents with   Other    Patient c/o of bump on right nose. Redness, sore. Started out as "pimple" last Friday.    HPI Patient was seen today for acute concern.  Pt with a bump on nose x 5 days.  Bump started as a pimple with redness and edema that is since spread.  Pimple no longer present but notices a firm area on right side of nose.  Edema underneath right eye causing increased tearing.  States can tell face is swollen but it is not painful. Pt rubs/presses on the area throughout the day.  Patient denies fever, chills, increased warmth of skin on face.  Tried Polysporin, ice, warm compresses, Vaseline.  Denies drainage of actual bump.  Started using nasal prong with CPAP 2 days ago.  Previously used face mask that went across bridge of nose.  Has appointment with sleep medicine in 2 weeks for face mask refitting.  Patient states temazepam is listed on med list however, he is not taking.  Told by pharmacist friend it could be addicting.  Past Medical History:  Diagnosis Date   Chest pain    Dyspnea    Hyperlipidemia    Hypertension    Metabolic syndrome    OSA (obstructive sleep apnea)    Sleep apnea     No Known Allergies  ROS General: Denies fever, chills, night sweats, changes in weight, changes in appetite HEENT: Denies headaches, ear pain, changes in vision, rhinorrhea, sore throat CV: Denies CP, palpitations, SOB, orthopnea Pulm: Denies SOB, cough, wheezing GI: Denies abdominal pain, nausea, vomiting, diarrhea, constipation GU: Denies dysuria, hematuria, frequency Msk: Denies muscle cramps, joint pains Neuro: Denies weakness, numbness, tingling Skin: Denies rashes, bruising + bump on nose Psych: Denies depression, anxiety, hallucinations     Objective:    Blood pressure (!) 150/76, pulse 66, temperature 97.8 F (36.6 C), temperature source Oral,  weight 199 lb 3.2 oz (90.4 kg), SpO2 97 %.  Gen. Pleasant, well-nourished, in no distress, normal affect   HEENT: [place]/AT, face symmetric, Erythema and edema inferior R eye and R lateral nose.  conjunctiva clear, no scleral icterus, PERRLA, EOMI, nares patent without drainage Lungs: no accessory muscle use Cardiovascular: RRR,  no peripheral edema Musculoskeletal: No deformities, no cyanosis or clubbing, normal tone Neuro:  A&Ox3, CN II-XII intact, normal gait Skin:  Warm, dry, intact.  Raised, dry appearing area of induration, without fluctuance on right lateral nose slightly inferior to bridge of nose with erythema surrounding tarea/to R upper cheek and inferior to right orbit with edema.  No drainage or increased warmth.   Wt Readings from Last 3 Encounters:  08/25/21 199 lb 3.2 oz (90.4 kg)  07/19/21 201 lb (91.2 kg)  07/19/21 195 lb 6.4 oz (88.6 kg)    Lab Results  Component Value Date   WBC 8.0 12/21/2020   HGB 16.5 12/21/2020   HCT 47.6 12/21/2020   PLT 269.0 12/21/2020   GLUCOSE 140 (H) 04/20/2021   CHOL 131 04/20/2021   TRIG 232.0 (H) 04/20/2021   HDL 36.30 (L) 04/20/2021   LDLDIRECT 69.0 04/20/2021   LDLCALC 116 (H) 08/07/2014   ALT 15 04/20/2021   AST 17 04/20/2021   NA 138 04/20/2021   K 3.8 04/20/2021   CL 95 (L) 04/20/2021   CREATININE 1.09 04/20/2021   BUN 17 04/20/2021  CO2 34 (H) 04/20/2021   TSH 4.25 12/21/2020   PSA 1.76 12/21/2020   HGBA1C 6.9 (A) 07/19/2021    Assessment/Plan:  Cellulitis of face -Given history of pustule with spreading erythema and edema we will treat as cellulitis. -Advised symptoms likely 2/2 from wearing CPAP facemask. -Start ABX given proximity to eye -Continue supportive care including ice, gentle moisturizers, etc. -Patient advised to avoid picking/scratching area may worsen symptoms. -Continue wearing nasal prong with CPAP while face is healing.  - Plan: sulfamethoxazole-trimethoprim (BACTRIM DS) 800-160 MG tablet  OSA  on CPAP -Continue wearing nasal prong with CPAP nightly -Encouraged to keep upcoming appointment with pulmonology/sleep medicine for face mask refitting  Essential hypertension -Uncontrolled -Repeat BP -Continue current medications including Norvasc 10 mg, HCTZ 25 mg, lisinopril 40 mg  F/u prn for acute concern.  F/u with pcp for HTN.    Grier Mitts, MD

## 2021-09-07 ENCOUNTER — Other Ambulatory Visit: Payer: Self-pay | Admitting: Family Medicine

## 2021-09-13 ENCOUNTER — Ambulatory Visit (HOSPITAL_BASED_OUTPATIENT_CLINIC_OR_DEPARTMENT_OTHER): Payer: Medicare Other | Attending: Internal Medicine | Admitting: Internal Medicine

## 2021-09-13 DIAGNOSIS — G4733 Obstructive sleep apnea (adult) (pediatric): Secondary | ICD-10-CM

## 2021-09-19 ENCOUNTER — Encounter: Payer: Self-pay | Admitting: Family Medicine

## 2021-09-19 ENCOUNTER — Ambulatory Visit (INDEPENDENT_AMBULATORY_CARE_PROVIDER_SITE_OTHER): Payer: Medicare Other | Admitting: Family Medicine

## 2021-09-19 VITALS — BP 136/68 | HR 55 | Temp 98.1°F | Ht 68.0 in | Wt 201.9 lb

## 2021-09-19 DIAGNOSIS — I781 Nevus, non-neoplastic: Secondary | ICD-10-CM | POA: Diagnosis not present

## 2021-09-19 MED ORDER — METRONIDAZOLE 0.75 % EX GEL: 1.0000 | Freq: Two times a day (BID) | CUTANEOUS | 1 refills | Status: DC

## 2021-09-19 NOTE — Progress Notes (Signed)
Established Patient Office Visit  Subjective   Patient ID: Tyrone Jefferson, male    DOB: 1955-09-12  Age: 66 y.o. MRN: [id number]  Chief Complaint  Patient presents with   Skin Problem    Patient complains of skin problem on right side of nose, x1 month   Cellulitis    HPI   Seen with some persistent redness right base of the nose.  I have been seen here previously and diagnosed with cellulitis and treated with Septra.  No fever.  No progressive redness.  When the area of redness was first noted he actually tried some icing and thinks he may have gotten this too cold.  He has some other scattered areas of erythema and telangiectasias on the nose.  Never diagnosed with rosacea.  No clear exacerbating factors.  No alcohol use.  No chronic topical steroids.  Nonpainful.  He has obstructive sleep apnea without CPAP.  Blood pressures are finally improved some.  Recent A1c improved to 6.9%.  Good compliance with CPAP  Past Medical History:  Diagnosis Date   Chest pain    Dyspnea    Hyperlipidemia    Hypertension    Metabolic syndrome    OSA (obstructive sleep apnea)    Sleep apnea    Past Surgical History:  Procedure Laterality Date   BACK SURGERY  1992   SPINE SURGERY  1992   L4-5 discectomy    reports that he has never smoked. He has never used smokeless tobacco. He reports that he does not drink alcohol and does not use drugs. family history includes Brain cancer (age of onset: 31) in his mother; Colon cancer in his father; Diabetes in his mother; Heart attack (age of onset: 68) in his mother; Heart disease (age of onset: 82) in his father; Hypertension in his mother; Stroke in his brother. No Known Allergies  Review of Systems  Constitutional:  Negative for malaise/fatigue.  Eyes:  Negative for blurred vision.  Respiratory:  Negative for shortness of breath.   Cardiovascular:  Negative for chest pain.  Skin:  Positive for rash.  Neurological:  Negative for dizziness,  weakness and headaches.      Objective:     BP 136/68 (BP Location: Left Arm, Patient Position: Sitting, Cuff Size: Normal)   Pulse (!) 55   Temp 98.1 F (36.7 C) (Oral)   Ht '5\' 8"'$  (1.727 m)   Wt 201 lb 14.4 oz (91.6 kg)   SpO2 98%   BMI 30.70 kg/m    Physical Exam Vitals reviewed.  Cardiovascular:     Rate and Rhythm: Normal rate and regular rhythm.  Pulmonary:     Effort: Pulmonary effort is normal.     Breath sounds: Normal breath sounds.  Skin:    Comments: He has a very small area of hyperemia right side of nose near the base.  Blanches with pressure.  He has several scattered small telangiectasias involving the right and left sides of the nose.  No papules.  No pustules.  No concerning skin lesions.      No results found for any visits on 09/19/21.    The 10-year ASCVD risk score (Arnett DK, et al., 2019) is: 26.4%    Assessment & Plan:   Patient has several scattered telangiectasias involving the nose.  No evidence for cellulitis at this time.  No evidence for skin cancer.  Query mild rosacea  -Discussed sun protection -Consider trial of metronidazole 0.75% gel twice daily but avoid contact  with eyes.  No follow-ups on file.    Carolann Littler, MD

## 2021-09-19 NOTE — Patient Instructions (Signed)
Consider trial of Metronidazole gel twice daily- and remember this may take several weeks to see full effect.    Be carefull not to get into eyes.

## 2021-10-03 DIAGNOSIS — G4733 Obstructive sleep apnea (adult) (pediatric): Secondary | ICD-10-CM | POA: Diagnosis not present

## 2021-10-05 ENCOUNTER — Other Ambulatory Visit: Payer: Self-pay | Admitting: Family Medicine

## 2021-10-05 DIAGNOSIS — I10 Essential (primary) hypertension: Secondary | ICD-10-CM

## 2021-10-19 ENCOUNTER — Encounter: Payer: Self-pay | Admitting: Family Medicine

## 2021-10-19 ENCOUNTER — Ambulatory Visit (INDEPENDENT_AMBULATORY_CARE_PROVIDER_SITE_OTHER): Payer: Medicare Other | Admitting: Family Medicine

## 2021-10-19 VITALS — BP 118/60 | HR 55 | Temp 98.5°F | Ht 68.0 in | Wt 198.3 lb

## 2021-10-19 DIAGNOSIS — I10 Essential (primary) hypertension: Secondary | ICD-10-CM | POA: Diagnosis not present

## 2021-10-19 DIAGNOSIS — E1165 Type 2 diabetes mellitus with hyperglycemia: Secondary | ICD-10-CM

## 2021-10-19 DIAGNOSIS — E119 Type 2 diabetes mellitus without complications: Secondary | ICD-10-CM

## 2021-10-19 LAB — POCT GLYCOSYLATED HEMOGLOBIN (HGB A1C): Hemoglobin A1C: 6.8 % — AB (ref 4.0–5.6)

## 2021-10-19 NOTE — Progress Notes (Signed)
6/8gftrHPI- M never smoker followed for OSA, complicated by HTN, OSA, Obesity, Hyperlipidemia, Metabolic Syndrome, DM2,  NPSG 05/03/10- AHI 19.7/ hr, desaturation to 85%, body weight 200 lbs, CPAP to 9 HST-04/04/21- AHI 28.4/ hr, desaturation to 76%/median 88%, body weight   ======================================================================== 07/19/21- 83 yoM never smoker followed for OSA, Insomnia complicated by HTN, OSA, Obesity, Hyperlipidemia, Metabolic Syndrome, DM2,  CPAP auto 5-20/ [employer]     ordered 04/11/21 Download compliance- 80%, AHI 2.3/ hr Body weight today Covid vax-3 Phizer Flu vax-no -----Patient states that he has not been able to find a mask that works for him and is comfortable.  We are referring to sleep center for mask fitting Discussed insomnia/ sleep habits. Try temazepam.  10/20/21-  66 yoM never smoker followed for OSA, Insomnia complicated by HTN, OSA, Obesity, Hyperlipidemia, Metabolic Syndrome, DM2,  CPAP auto 5-20/ [employer]     AirSense10AutoSet        ordered 04/11/21 Download compliance- 77%, AHI 1.9/ hr Body weight today-200 lbs Covid vax-3 Phizer -----Pt f/u on for OSA he says his sleep has been okay. Getting approx 6-7hrs/night of sleep.  He and his wife are in separate rooms because his CPAP bothers her.  She thinks he is breathing quietly and sleeping better.  He says CPAP mask is comfortable now.  Download reviewed.  We did discuss alternatives to CPAP.  ROS-see HPI   + = positive Constitutional:    weight loss, night sweats, fevers, chills, fatigue, lassitude. HEENT:    headaches, difficulty swallowing, tooth/dental problems, sore throat,       sneezing, itching, ear ache, nasal congestion, post nasal drip, snoring CV:    chest pain, orthopnea, PND, swelling in lower extremities, anasarca,                                   dizziness, palpitations Resp:   shortness of breath with exertion or at rest.                productive cough,   non-productive  cough, coughing up of blood.              change in color of mucus.  wheezing.   Skin:    rash or lesions. GI:  No-   heartburn, indigestion, abdominal pain, nausea, vomiting, diarrhea,                 change in bowel habits, loss of appetite GU: dysuria, change in color of urine, no urgency or frequency.   flank pain. MS:   joint pain, stiffness, decreased range of motion, back pain. Neuro-     nothing unusual Psych:  change in mood or affect.  depression or anxiety.   memory loss.  OBJ- Physical Exam General- Alert, Oriented, Affect-appropriate, Distress- none acute Skin- rash-none, lesions- none, excoriation- none Lymphadenopathy- none Head- atraumatic            Eyes- Gross vision intact, PERRLA, conjunctivae and secretions clear            Ears- Hearing, canals-normal            Nose- Clear, no-Septal dev, mucus, polyps, erosion, perforation             Throat- Mallampati IV , mucosa clear , drainage- none, tonsils- atrophic, + teeth Neck- flexible , trachea midline, no stridor , thyroid nl, carotid no bruit Chest - symmetrical excursion , unlabored  Heart/CV- RRR , no murmur , no gallop  , no rub, nl s1 s2                           - JVD- none , edema- none, stasis changes- none, varices- none           Lung- clear to P&A, wheeze- none, cough- none , dullness-none, rub- none           Chest wall-  Abd-  Br/ Gen/ Rectal- Not done, not indicated Extrem- cyanosis- none, clubbing, none, atrophy- none, strength- nl Neuro- grossly intact to observation

## 2021-10-19 NOTE — Progress Notes (Signed)
Established Patient Office Visit  Subjective   Patient ID: Tyrone Jefferson, male    DOB: [birth year]  Age: 66 y.o. MRN: [id number]  Chief Complaint  Patient presents with   Follow-up    HPI   Tyrone Jefferson is here for follow-up regarding type 2 diabetes and hypertension.  A1c a year ago was 9 and then came down to 6.9 on metformin.  Not monitoring blood sugars regularly.  Patient has history also of resistant hypertension.  Last April he went on CPAP and that seems to be working well.  Excellent compliance.  Feels better overall.  Recent blood pressures much better controlled.  Denies any recent chest pains, dizziness, or dyspnea.  He remains on statin for hyperlipidemia.  Physical last year was in November.  Past Medical History:  Diagnosis Date   Chest pain    Dyspnea    Hyperlipidemia    Hypertension    Metabolic syndrome    OSA (obstructive sleep apnea)    Sleep apnea    Past Surgical History:  Procedure Laterality Date   BACK SURGERY  1992   SPINE SURGERY  1992   L4-5 discectomy    reports that he has never smoked. He has never used smokeless tobacco. He reports that he does not drink alcohol and does not use drugs. family history includes Brain cancer (age of onset: 31) in his mother; Colon cancer in his father; Diabetes in his mother; Heart attack (age of onset: 55) in his mother; Heart disease (age of onset: 1) in his father; Hypertension in his mother; Stroke in his brother. No Known Allergies  Review of Systems  Constitutional:  Negative for malaise/fatigue.  Eyes:  Negative for blurred vision.  Respiratory:  Negative for shortness of breath.   Cardiovascular:  Negative for chest pain.  Neurological:  Negative for dizziness, weakness and headaches.      Objective:     BP 118/60 (BP Location: Left Arm, Patient Position: Sitting, Cuff Size: Normal)   Pulse (!) 55   Temp 98.5 F (36.9 C) (Oral)   Ht '5\' 8"'$  (1.727 m)   Wt 198 lb 4.8 oz (89.9 kg)   SpO2 96%   BMI  30.15 kg/m  BP Readings from Last 3 Encounters:  10/19/21 118/60  09/19/21 136/68  08/25/21 (!) 150/76   Wt Readings from Last 3 Encounters:  10/19/21 198 lb 4.8 oz (89.9 kg)  09/19/21 201 lb 14.4 oz (91.6 kg)  08/25/21 199 lb 3.2 oz (90.4 kg)      Physical Exam Constitutional:      Appearance: He is well-developed.  HENT:     Right Ear: External ear normal.     Left Ear: External ear normal.  Eyes:     Pupils: Pupils are equal, round, and reactive to light.  Neck:     Thyroid: No thyromegaly.  Cardiovascular:     Rate and Rhythm: Normal rate and regular rhythm.  Pulmonary:     Effort: Pulmonary effort is normal. No respiratory distress.     Breath sounds: Normal breath sounds. No wheezing or rales.  Musculoskeletal:     Cervical back: Neck supple.  Neurological:     Mental Status: He is alert and oriented to person, place, and time.      Results for orders placed or performed in visit on 10/19/21  POCT glycosylated hemoglobin (Hb A1C)  Result Value Ref Range   Hemoglobin A1C 6.8 (A) 4.0 - 5.6 %   HbA1c POC (<>  result, manual entry)     HbA1c, POC (prediabetic range)     HbA1c, POC (controlled diabetic range)        The 10-year ASCVD risk score (Arnett DK, et al., 2019) is: 21.1%    Assessment & Plan:   #1 type 2 diabetes controlled with A1c 6.8%.  Continue low glycemic diet.  We did discuss consideration for continuous glucose monitoring system and he will consider.  Handout given.  Reassess in 3 to 4 months  #2 hypertension improved and at goal.  Continue amlodipine, HCTZ, metoprolol, and lisinopril  #3 obstructive sleep apnea improved on CPAP   Return in about 4 months (around 02/18/2022).    Carolann Littler, MD

## 2021-10-19 NOTE — Patient Instructions (Signed)
Set up physical exam for about 4 months from now.

## 2021-10-20 ENCOUNTER — Ambulatory Visit: Payer: Medicare Other | Admitting: Internal Medicine

## 2021-10-20 ENCOUNTER — Encounter: Payer: Self-pay | Admitting: Internal Medicine

## 2021-10-20 DIAGNOSIS — E669 Obesity, unspecified: Secondary | ICD-10-CM | POA: Diagnosis not present

## 2021-10-20 DIAGNOSIS — G4733 Obstructive sleep apnea (adult) (pediatric): Secondary | ICD-10-CM | POA: Diagnosis not present

## 2021-10-20 NOTE — Patient Instructions (Signed)
We can continue CPAP auto 5-20  Feel free to contact us if we can help

## 2021-10-28 NOTE — Assessment & Plan Note (Signed)
Weight has been fairly stable around 200 pounds.  Continue attention to diet and exercise

## 2021-10-28 NOTE — Assessment & Plan Note (Signed)
Benefits from CPAP with adequate compliance and good control.  We discussed alternatives. Plan-continue auto 5-20

## 2021-11-03 DIAGNOSIS — G4733 Obstructive sleep apnea (adult) (pediatric): Secondary | ICD-10-CM | POA: Diagnosis not present

## 2021-12-03 DIAGNOSIS — G4733 Obstructive sleep apnea (adult) (pediatric): Secondary | ICD-10-CM | POA: Diagnosis not present

## 2021-12-31 ENCOUNTER — Other Ambulatory Visit: Payer: Self-pay | Admitting: Family Medicine

## 2021-12-31 DIAGNOSIS — I10 Essential (primary) hypertension: Secondary | ICD-10-CM

## 2022-01-02 DIAGNOSIS — G4733 Obstructive sleep apnea (adult) (pediatric): Secondary | ICD-10-CM | POA: Diagnosis not present

## 2022-01-03 DIAGNOSIS — G4733 Obstructive sleep apnea (adult) (pediatric): Secondary | ICD-10-CM | POA: Diagnosis not present

## 2022-01-24 ENCOUNTER — Encounter: Payer: Self-pay | Admitting: Family Medicine

## 2022-01-24 ENCOUNTER — Ambulatory Visit (INDEPENDENT_AMBULATORY_CARE_PROVIDER_SITE_OTHER): Payer: Medicare Other | Admitting: Family Medicine

## 2022-01-24 VITALS — BP 120/60 | HR 72 | Temp 97.8°F | Ht 68.9 in | Wt 200.9 lb

## 2022-01-24 DIAGNOSIS — E785 Hyperlipidemia, unspecified: Secondary | ICD-10-CM | POA: Diagnosis not present

## 2022-01-24 DIAGNOSIS — Z125 Encounter for screening for malignant neoplasm of prostate: Secondary | ICD-10-CM

## 2022-01-24 DIAGNOSIS — Z23 Encounter for immunization: Secondary | ICD-10-CM | POA: Diagnosis not present

## 2022-01-24 DIAGNOSIS — E1165 Type 2 diabetes mellitus with hyperglycemia: Secondary | ICD-10-CM

## 2022-01-24 DIAGNOSIS — I10 Essential (primary) hypertension: Secondary | ICD-10-CM | POA: Diagnosis not present

## 2022-01-24 DIAGNOSIS — Z0000 Encounter for general adult medical examination without abnormal findings: Secondary | ICD-10-CM

## 2022-01-24 LAB — LIPID PANEL
Cholesterol: 111 mg/dL (ref 0–200)
HDL: 37.1 mg/dL — ABNORMAL LOW (ref >39.00)
LDL Cholesterol: 42 mg/dL (ref 0–99)
NonHDL: 74.3
Total CHOL/HDL Ratio: 3
Triglycerides: 163 mg/dL — ABNORMAL HIGH (ref 0.0–149.0)
VLDL: 32.6 mg/dL (ref 0.0–40.0)

## 2022-01-24 LAB — HEPATIC FUNCTION PANEL
ALT: 13 U/L (ref 0–53)
AST: 13 U/L (ref 0–37)
Albumin: 4.8 g/dL (ref 3.5–5.2)
Alkaline Phosphatase: 71 U/L (ref 39–117)
Bilirubin, Direct: 0.1 mg/dL (ref 0.0–0.3)
Total Bilirubin: 0.6 mg/dL (ref 0.2–1.2)
Total Protein: 7.4 g/dL (ref 6.0–8.3)

## 2022-01-24 LAB — CBC WITH DIFFERENTIAL/PLATELET
Basophils Absolute: 0 10*3/uL (ref 0.0–0.1)
Basophils Relative: 0.3 % (ref 0.0–3.0)
Eosinophils Absolute: 0.1 10*3/uL (ref 0.0–0.7)
Eosinophils Relative: 1.2 % (ref 0.0–5.0)
HCT: 45 % (ref 39.0–52.0)
Hemoglobin: 15.9 g/dL (ref 13.0–17.0)
Lymphocytes Relative: 27 % (ref 12.0–46.0)
Lymphs Abs: 2.4 10*3/uL (ref 0.7–4.0)
MCHC: 35.2 g/dL (ref 30.0–36.0)
MCV: 83.9 fl (ref 78.0–100.0)
Monocytes Absolute: 0.8 10*3/uL (ref 0.1–1.0)
Monocytes Relative: 9.4 % (ref 3.0–12.0)
Neutro Abs: 5.5 10*3/uL (ref 1.4–7.7)
Neutrophils Relative %: 62.1 % (ref 43.0–77.0)
Platelets: 305 10*3/uL (ref 150.0–400.0)
RBC: 5.37 Mil/uL (ref 4.22–5.81)
RDW: 13.2 % (ref 11.5–15.5)
WBC: 8.8 10*3/uL (ref 4.0–10.5)

## 2022-01-24 LAB — BASIC METABOLIC PANEL
BUN: 16 mg/dL (ref 6–23)
CO2: 35 mEq/L — ABNORMAL HIGH (ref 19–32)
Calcium: 9.1 mg/dL (ref 8.4–10.5)
Chloride: 95 mEq/L — ABNORMAL LOW (ref 96–112)
Creatinine, Ser: 0.92 mg/dL (ref 0.40–1.50)
GFR: 87.11 mL/min (ref >60.00)
Glucose, Bld: 135 mg/dL — ABNORMAL HIGH (ref 70–99)
Potassium: 3.4 mEq/L — ABNORMAL LOW (ref 3.5–5.1)
Sodium: 140 mEq/L (ref 135–145)

## 2022-01-24 LAB — MICROALBUMIN / CREATININE URINE RATIO
Creatinine,U: 177.9 mg/dL
Microalb Creat Ratio: 3.2 mg/g (ref 0.0–30.0)
Microalb, Ur: 5.7 mg/dL — ABNORMAL HIGH (ref 0.0–1.9)

## 2022-01-24 LAB — HEMOGLOBIN A1C: Hgb A1c MFr Bld: 7.1 % — ABNORMAL HIGH (ref 4.6–6.5)

## 2022-01-24 LAB — PSA, MEDICARE: PSA: 2.27 ng/ml (ref 0.10–4.00)

## 2022-01-24 NOTE — Addendum Note (Signed)
Addended by: Nilda Riggs on: 01/24/2022 08:33 AM   Modules accepted: Orders

## 2022-01-24 NOTE — Progress Notes (Signed)
Established Patient Office Visit  Subjective   Patient ID: Tyrone Jefferson, male    DOB: 1955/09/26  Age: 66 y.o. MRN: [id number]  No chief complaint on file.   HPI   Kitai is seen for physical exam.  His chronic problems include history of obesity, metabolic syndrome, type 2 diabetes, hyperlipidemia, hypertension, obstructive sleep apnea.  His blood pressures have actually been much better controlled recently by home readings as well as reading here.  His diabetes was improving with last A1c 6.8%.  Generally feels well.  He retired during the past year.  Not exercising consistently.  Eye exams up-to-date.  Health maintenance reviewed  -Declines flu vaccine -Needs Prevnar 20 -No history of Shingrix vaccine -Tetanus due at this time -Colonoscopy due 2 years from now  Family history-Father had colon cancer.  His mother had history of CAD in her late 75s and apparently some type of brain tumor.  He had a brother with history of stroke.   Social history-he retired 2020 from [employer].  Wife is also retired.  They have no children.  He has never smoked.  No alcohol.  Past Medical History:  Diagnosis Date   Chest pain    Dyspnea    Hyperlipidemia    Hypertension    Metabolic syndrome    OSA (obstructive sleep apnea)    Sleep apnea    Past Surgical History:  Procedure Laterality Date   BACK SURGERY  1992   SPINE SURGERY  1992   L4-5 discectomy    reports that he has never smoked. He has never used smokeless tobacco. He reports that he does not drink alcohol and does not use drugs. family history includes Brain cancer (age of onset: 57) in his mother; Colon cancer in his father; Diabetes in his mother; Heart attack (age of onset: 11) in his mother; Heart disease (age of onset: 83) in his father; Hypertension in his mother; Stroke in his brother. No Known Allergies   Review of Systems  Constitutional:  Negative for chills, fever, malaise/fatigue and weight loss.   HENT:  Negative for hearing loss.   Eyes:  Negative for blurred vision and double vision.  Respiratory:  Negative for cough and shortness of breath.   Cardiovascular:  Negative for chest pain, palpitations and leg swelling.  Gastrointestinal:  Negative for abdominal pain, blood in stool, constipation and diarrhea.  Genitourinary:  Negative for dysuria.  Skin:  Negative for rash.  Neurological:  Negative for dizziness, speech change, seizures, loss of consciousness and headaches.  Psychiatric/Behavioral:  Negative for depression.       Objective:     BP 120/60 (BP Location: Left Arm, Patient Position: Sitting, Cuff Size: Large)   Pulse 72   Temp 97.8 F (36.6 C) (Oral)   Ht 5' 8.9" (1.75 m)   Wt 200 lb 14.4 oz (91.1 kg)   SpO2 99%   BMI 29.76 kg/m  BP Readings from Last 3 Encounters:  01/24/22 120/60  10/20/21 120/70  10/19/21 118/60   Wt Readings from Last 3 Encounters:  01/24/22 200 lb 14.4 oz (91.1 kg)  10/20/21 200 lb 12.8 oz (91.1 kg)  10/19/21 198 lb 4.8 oz (89.9 kg)      Physical Exam Constitutional:      General: He is not in acute distress.    Appearance: He is well-developed.  HENT:     Head: Normocephalic and atraumatic.     Right Ear: External ear normal.     Left  Ear: External ear normal.  Eyes:     Conjunctiva/sclera: Conjunctivae normal.     Pupils: Pupils are equal, round, and reactive to light.  Neck:     Thyroid: No thyromegaly.  Cardiovascular:     Rate and Rhythm: Normal rate and regular rhythm.     Heart sounds: Normal heart sounds. No murmur heard. Pulmonary:     Effort: No respiratory distress.     Breath sounds: No wheezing or rales.  Abdominal:     General: Bowel sounds are normal. There is no distension.     Palpations: Abdomen is soft. There is no mass.     Tenderness: There is no abdominal tenderness. There is no guarding or rebound.  Musculoskeletal:     Cervical back: Normal range of motion and neck supple.  Lymphadenopathy:      Cervical: No cervical adenopathy.  Skin:    Findings: No rash.     Comments: Feet reveal no skin lesions. Good distal foot pulses. Good capillary refill. No calluses. Normal sensation with monofilament testing.  Does have some abnormal thickening and brittle changes of both great toenails consistent with likely onychomycosis.   Neurological:     Mental Status: He is alert and oriented to person, place, and time.     Cranial Nerves: No cranial nerve deficit.      No results found for any visits on 01/24/22.    The 10-year ASCVD risk score (Arnett DK, et al., 2019) is: 21.7%    Assessment & Plan:   Problem List Items Addressed This Visit       Unprioritized   Dyslipidemia - Primary   Relevant Orders   Lipid panel   Hepatic function panel   Type 2 diabetes mellitus with hyperglycemia ([place])   Relevant Orders   CBC with Differential/Platelet   Hemoglobin A1c   Microalbumin / creatinine urine ratio   Hypertension   Relevant Orders   Basic metabolic panel   Other Visit Diagnoses     Prostate cancer screening       Relevant Orders   PSA, Medicare     Here for physical exam today.  He has multiple chronic problems as above.  His blood pressure is well controlled today.  Recent A1c showed better control as well.  We discussed the following health maintenance issues  -Flu vaccine recommended but patient declines -He does agree to [employer] 20 -We discussed Shingrix vaccine and he will consider checking on insurance coverage -Check urine microalbumin screen -Check other labs as above -The natural history of prostate cancer and ongoing controversy regarding screening and potential treatment outcomes of prostate cancer has been discussed with the patient. The meaning of a false positive PSA and a false negative PSA has been discussed. He indicates understanding of the limitations of this screening test and wishes to proceed with screening PSA testing -Repeat colonoscopy in 2  years -We challenged him to try to step up more consistent exercise with goals of 10,000 steps per day and minimum of 150 minutes of moderate aerobic exercise per week  Return in about 3 months (around 04/25/2022).    Carolann Littler, MD

## 2022-01-24 NOTE — Patient Instructions (Signed)
Consider Shingrix vaccine at some point this year.   Continue with yearly diabetic eye exam.

## 2022-02-01 DIAGNOSIS — G4733 Obstructive sleep apnea (adult) (pediatric): Secondary | ICD-10-CM | POA: Diagnosis not present

## 2022-03-02 ENCOUNTER — Other Ambulatory Visit: Payer: Self-pay | Admitting: Family Medicine

## 2022-03-04 DIAGNOSIS — G4733 Obstructive sleep apnea (adult) (pediatric): Secondary | ICD-10-CM | POA: Diagnosis not present

## 2022-03-29 ENCOUNTER — Other Ambulatory Visit: Payer: Self-pay | Admitting: Family Medicine

## 2022-03-29 DIAGNOSIS — I10 Essential (primary) hypertension: Secondary | ICD-10-CM

## 2022-03-31 ENCOUNTER — Other Ambulatory Visit: Payer: Self-pay | Admitting: Family Medicine

## 2022-04-04 DIAGNOSIS — G4733 Obstructive sleep apnea (adult) (pediatric): Secondary | ICD-10-CM | POA: Diagnosis not present

## 2022-04-11 ENCOUNTER — Other Ambulatory Visit: Payer: Self-pay | Admitting: Family Medicine

## 2022-04-25 ENCOUNTER — Ambulatory Visit (INDEPENDENT_AMBULATORY_CARE_PROVIDER_SITE_OTHER): Payer: Medicare Other | Admitting: Family Medicine

## 2022-04-25 ENCOUNTER — Encounter: Payer: Self-pay | Admitting: Family Medicine

## 2022-04-25 VITALS — BP 134/66 | HR 65 | Temp 98.0°F | Ht 68.9 in | Wt 198.9 lb

## 2022-04-25 DIAGNOSIS — E785 Hyperlipidemia, unspecified: Secondary | ICD-10-CM

## 2022-04-25 DIAGNOSIS — I10 Essential (primary) hypertension: Secondary | ICD-10-CM

## 2022-04-25 DIAGNOSIS — E1165 Type 2 diabetes mellitus with hyperglycemia: Secondary | ICD-10-CM

## 2022-04-25 LAB — POCT GLYCOSYLATED HEMOGLOBIN (HGB A1C): Hemoglobin A1C: 7 % — AB (ref 4.0–5.6)

## 2022-04-25 MED ORDER — BLOOD GLUCOSE MONITORING SUPPL DEVI: 1.0000 | Freq: Three times a day (TID) | 1 refills | Status: AC

## 2022-04-25 MED ORDER — LANCET DEVICE MISC: 1.0000 | Freq: Three times a day (TID) | 0 refills | Status: AC

## 2022-04-25 MED ORDER — BLOOD GLUCOSE TEST VI STRP: 1.0000 | ORAL_STRIP | Freq: Three times a day (TID) | 2 refills | Status: AC

## 2022-04-25 MED ORDER — LANCETS MISC. MISC: 1.0000 | Freq: Three times a day (TID) | 2 refills | Status: AC

## 2022-04-25 NOTE — Patient Instructions (Addendum)
Get home glucose monitor and monitor sugars at least a couple of times per week  Establish more consistent exercise

## 2022-04-25 NOTE — Progress Notes (Signed)
Established Patient Office Visit  Subjective   Patient ID: Tyrone Jefferson, male    DOB: 1955-05-22  Age: 67 y.o. MRN: [id number]  Chief Complaint  Patient presents with   Medical Management of Chronic Issues    HPI   Tyrone Jefferson is here accompanied by wife for follow-up his chronic medical problems.  He has history of type 2 diabetes, hyperlipidemia, hypertension.  His blood pressure and hypertension have improved significantly in recent months.  A1c a year ago was 9.0%.  He had a couple readings below 7.  Current medications reviewed and include amlodipine 10 mg daily, HCTZ 25 mg daily, lisinopril 40 mg daily, metformin XR 750 mg daily, Toprol-XL 50 mg daily K-Lor 20 mill equivalents daily, and rosuvastatin 10 mg daily.  Compliant with medications.  Currently not exercising regularly.  Currently does not have home glucose monitor but would like to get 1.  He is having some urine urgency but no recent burning with urination.  He had recent full set of labs in December.  These were reviewed.  Microalbumin/creatinine ratio was normal.  PSA stable.  Lipids fairly well-controlled.  The major complaint this time is bilateral hearing loss.  He knows he needs to see audiologist but has been delinquent setting this up.  Past Medical History:  Diagnosis Date   Chest pain    Dyspnea    Hyperlipidemia    Hypertension    Metabolic syndrome    OSA (obstructive sleep apnea)    Sleep apnea    Past Surgical History:  Procedure Laterality Date   BACK SURGERY  1992   SPINE SURGERY  1992   L4-5 discectomy    reports that he has never smoked. He has never used smokeless tobacco. He reports that he does not drink alcohol and does not use drugs. family history includes Brain cancer (age of onset: 34) in his mother; Colon cancer in his father; Diabetes in his mother; Heart attack (age of onset: 73) in his mother; Heart disease (age of onset: 79) in his father; Hypertension in his mother; Stroke  in his brother. No Known Allergies  Review of Systems  Constitutional:  Negative for malaise/fatigue.  Eyes:  Negative for blurred vision.  Respiratory:  Negative for shortness of breath.   Cardiovascular:  Negative for chest pain.  Genitourinary:  Positive for urgency. Negative for dysuria.  Neurological:  Negative for dizziness, weakness and headaches.      Objective:     BP 134/66 (BP Location: Left Arm, Patient Position: Sitting, Cuff Size: Normal)   Pulse 65   Temp 98 F (36.7 C) (Oral)   Ht 5' 8.9" (1.75 m)   Wt 198 lb 14.4 oz (90.2 kg)   SpO2 97%   BMI 29.46 kg/m  BP Readings from Last 3 Encounters:  04/25/22 134/66  01/24/22 120/60  10/20/21 120/70   Wt Readings from Last 3 Encounters:  04/25/22 198 lb 14.4 oz (90.2 kg)  01/24/22 200 lb 14.4 oz (91.1 kg)  10/20/21 200 lb 12.8 oz (91.1 kg)      Physical Exam Vitals reviewed.  Constitutional:      Appearance: He is well-developed.  Eyes:     Pupils: Pupils are equal, round, and reactive to light.  Neck:     Thyroid: No thyromegaly.  Cardiovascular:     Rate and Rhythm: Normal rate and regular rhythm.  Pulmonary:     Effort: Pulmonary effort is normal. No respiratory distress.     Breath  sounds: Normal breath sounds. No wheezing or rales.  Musculoskeletal:     Cervical back: Neck supple.     Right lower leg: No edema.     Left lower leg: No edema.  Neurological:     Mental Status: He is alert and oriented to person, place, and time.      Results for orders placed or performed in visit on 04/25/22  POC HgB A1c  Result Value Ref Range   Hemoglobin A1C 7.0 (A) 4.0 - 5.6 %   HbA1c POC (<> result, manual entry)     HbA1c, POC (prediabetic range)     HbA1c, POC (controlled diabetic range)        The ASCVD Risk score (Arnett DK, et al., 2019) failed to calculate for the following reasons:   The valid total cholesterol range is 130 to 320 mg/dL    Assessment & Plan:   #1 type 2 diabetes stable  with A1c 7.0%.  Continue metformin.  We strongly advocated he try to establish a consistent exercise program with both aerobic and resistance components.  We sent in prescription for glucose monitor and test strips.  We did discuss continuous glucose monitoring systems but he declines at this time.  Continue regular yearly diabetic eye exam.  Set up 40-monthfollow-up  #2 hypertension stable.  Continue amlodipine, HCTZ, lisinopril, and metoprolol.  Try to lose some weight.  Hopefully with establishing more consistent exercise can improve further-or perhaps come off some medications  #3 hyperlipidemia treated with rosuvastatin.  Recent lipids fairly well-controlled.  Continue rosuvastatin 10 mg daily No follow-ups on file.    BCarolann Littler MD

## 2022-04-29 ENCOUNTER — Other Ambulatory Visit: Payer: Self-pay | Admitting: Family Medicine

## 2022-05-03 DIAGNOSIS — G4733 Obstructive sleep apnea (adult) (pediatric): Secondary | ICD-10-CM | POA: Diagnosis not present

## 2022-05-09 ENCOUNTER — Telehealth: Payer: Self-pay | Admitting: Family Medicine

## 2022-05-09 NOTE — Telephone Encounter (Signed)
[place] to schedule their annual wellness visit. Appointment made for 05/10/22.  [facility] direct phone # [phone]

## 2022-05-10 ENCOUNTER — Ambulatory Visit (INDEPENDENT_AMBULATORY_CARE_PROVIDER_SITE_OTHER): Payer: Medicare Other

## 2022-05-10 VITALS — Ht 68.9 in | Wt 198.0 lb

## 2022-05-10 DIAGNOSIS — Z0000 Encounter for general adult medical examination without abnormal findings: Secondary | ICD-10-CM

## 2022-05-10 NOTE — Patient Instructions (Addendum)
Tyrone Jefferson , Thank you for taking time to come for your Medicare Wellness Visit. I appreciate your ongoing commitment to your health goals. Please review the following plan we discussed and let me know if I can assist you in the future.   These are the goals we discussed:  Goals       Stay Healthy (pt-stated)        This is a list of the screening recommended for you and due dates:  Health Maintenance  Topic Date Due   Complete foot exam   Never done   DTaP/Tdap/Td vaccine (2 - Td or Tdap) 10/11/2021   Flu Shot  05/21/2022*   COVID-19 Vaccine (4 - 2023-24 season) 05/26/2022*   Zoster (Shingles) Vaccine (1 of 2) 08/10/2022*   Eye exam for diabetics  05/15/2022   Hemoglobin A1C  10/26/2022   Yearly kidney function blood test for diabetes  01/25/2023   Yearly kidney health urinalysis for diabetes  01/25/2023   Medicare Annual Wellness Visit  05/10/2023   Colon Cancer Screening  01/02/2024   Pneumonia Vaccine  Completed   Hepatitis C Screening: USPSTF Recommendation to screen - Ages 18-79 yo.  Completed   HPV Vaccine  Aged Out  *Topic was postponed. The date shown is not the original due date.    Advanced directives: Please bring a copy of your health care power of attorney and living will to the office to be added to your chart at your convenience.   Conditions/risks identified: None  Next appointment: Follow up in one year for your annual wellness visit.    Preventive Care 66 Years and Older, Male  Preventive care refers to lifestyle choices and visits with your health care provider that can promote health and wellness. What does preventive care include? A yearly physical exam. This is also called an annual well check. Dental exams once or twice a year. Routine eye exams. Ask your health care provider how often you should have your eyes checked. Personal lifestyle choices, including: Daily care of your teeth and gums. Regular physical activity. Eating a healthy  diet. Avoiding tobacco and drug use. Limiting alcohol use. Practicing safe sex. Taking low doses of aspirin every day. Taking vitamin and mineral supplements as recommended by your health care provider. What happens during an annual well check? The services and screenings done by your health care provider during your annual well check will depend on your age, overall health, lifestyle risk factors, and family history of disease. Counseling  Your health care provider may ask you questions about your: Alcohol use. Tobacco use. Drug use. Emotional well-being. Home and relationship well-being. Sexual activity. Eating habits. History of falls. Memory and ability to understand (cognition). Work and work Statistician. Screening  You may have the following tests or measurements: Height, weight, and BMI. Blood pressure. Lipid and cholesterol levels. These may be checked every 5 years, or more frequently if you are over 2 years old. Skin check. Lung cancer screening. You may have this screening every year starting at age 4 if you have a 30-pack-year history of smoking and currently smoke or have quit within the past 15 years. Fecal occult blood test (FOBT) of the stool. You may have this test every year starting at age 67. Flexible sigmoidoscopy or colonoscopy. You may have a sigmoidoscopy every 5 years or a colonoscopy every 10 years starting at age 16. Prostate cancer screening. Recommendations will vary depending on your family history and other risks. Hepatitis C blood test.  Hepatitis B blood test. Sexually transmitted disease (STD) testing. Diabetes screening. This is done by checking your blood sugar (glucose) after you have not eaten for a while (fasting). You may have this done every 1-3 years. Abdominal aortic aneurysm (AAA) screening. You may need this if you are a current or former smoker. Osteoporosis. You may be screened starting at age 82 if you are at high risk. Talk with  your health care provider about your test results, treatment options, and if necessary, the need for more tests. Vaccines  Your health care provider may recommend certain vaccines, such as: Influenza vaccine. This is recommended every year. Tetanus, diphtheria, and acellular pertussis (Tdap, Td) vaccine. You may need a Td booster every 10 years. Zoster vaccine. You may need this after age 83. Pneumococcal 13-valent conjugate (PCV13) vaccine. One dose is recommended after age 66. Pneumococcal polysaccharide (PPSV23) vaccine. One dose is recommended after age 88. Talk to your health care provider about which screenings and vaccines you need and how often you need them. This information is not intended to replace advice given to you by your health care provider. Make sure you discuss any questions you have with your health care provider. Document Released: 03/05/2015 Document Revised: 10/27/2015 Document Reviewed: 12/08/2014 Elsevier Interactive Patient Education  2017 [employer] Prevention in the Home Falls can cause injuries. They can happen to people of all ages. There are many things you can do to make your home safe and to help prevent falls. What can I do on the outside of my home? Regularly fix the edges of walkways and driveways and fix any cracks. Remove anything that might make you trip as you walk through a door, such as a raised step or threshold. Trim any bushes or trees on the path to your home. Use bright outdoor lighting. Clear any walking paths of anything that might make someone trip, such as rocks or tools. Regularly check to see if handrails are loose or broken. Make sure that both sides of any steps have handrails. Any raised decks and porches should have guardrails on the edges. Have any leaves, snow, or ice cleared regularly. Use sand or salt on walking paths during winter. Clean up any spills in your garage right away. This includes oil or grease spills. What  can I do in the bathroom? Use night lights. Install grab bars by the toilet and in the tub and shower. Do not use towel bars as grab bars. Use non-skid mats or decals in the tub or shower. If you need to sit down in the shower, use a plastic, non-slip stool. Keep the floor dry. Clean up any water that spills on the floor as soon as it happens. Remove soap buildup in the tub or shower regularly. Attach bath mats securely with double-sided non-slip rug tape. Do not have throw rugs and other things on the floor that can make you trip. What can I do in the bedroom? Use night lights. Make sure that you have a light by your bed that is easy to reach. Do not use any sheets or blankets that are too big for your bed. They should not hang down onto the floor. Have a firm chair that has side arms. You can use this for support while you get dressed. Do not have throw rugs and other things on the floor that can make you trip. What can I do in the kitchen? Clean up any spills right away. Avoid walking on wet floors.  Keep items that you use a lot in easy-to-reach places. If you need to reach something above you, use a strong step stool that has a grab bar. Keep electrical cords out of the way. Do not use floor polish or wax that makes floors slippery. If you must use wax, use non-skid floor wax. Do not have throw rugs and other things on the floor that can make you trip. What can I do with my stairs? Do not leave any items on the stairs. Make sure that there are handrails on both sides of the stairs and use them. Fix handrails that are broken or loose. Make sure that handrails are as long as the stairways. Check any carpeting to make sure that it is firmly attached to the stairs. Fix any carpet that is loose or worn. Avoid having throw rugs at the top or bottom of the stairs. If you do have throw rugs, attach them to the floor with carpet tape. Make sure that you have a light switch at the top of the  stairs and the bottom of the stairs. If you do not have them, ask someone to add them for you. What else can I do to help prevent falls? Wear shoes that: Do not have high heels. Have rubber bottoms. Are comfortable and fit you well. Are closed at the toe. Do not wear sandals. If you use a stepladder: Make sure that it is fully opened. Do not climb a closed stepladder. Make sure that both sides of the stepladder are locked into place. Ask someone to hold it for you, if possible. Clearly mark and make sure that you can see: Any grab bars or handrails. First and last steps. Where the edge of each step is. Use tools that help you move around (mobility aids) if they are needed. These include: Canes. Walkers. Scooters. Crutches. Turn on the lights when you go into a dark area. Replace any light bulbs as soon as they burn out. Set up your furniture so you have a clear path. Avoid moving your furniture around. If any of your floors are uneven, fix them. If there are any pets around you, be aware of where they are. Review your medicines with your doctor. Some medicines can make you feel dizzy. This can increase your chance of falling. Ask your doctor what other things that you can do to help prevent falls. This information is not intended to replace advice given to you by your health care provider. Make sure you discuss any questions you have with your health care provider. Document Released: 12/03/2008 Document Revised: 07/15/2015 Document Reviewed: 03/13/2014 Elsevier Interactive Patient Education  2017 [employer].

## 2022-05-10 NOTE — Progress Notes (Signed)
Subjective:   Tyrone Jefferson is a 67 y.o. male who presents for Medicare Annual/Subsequent preventive examination.  Review of Systems    Virtual Visit via Telephone Note  I connected with  Tyrone Jefferson on 05/10/22 at  1:00 PM EDT by telephone and verified that I am speaking with the correct person using two identifiers.  Location: Patient: Home Provider: Office Persons participating in the virtual visit: patient/Nurse Health Advisor   I discussed the limitations, risks, security and privacy concerns of performing an evaluation and management service by telephone and the availability of in person appointments. The patient expressed understanding and agreed to proceed.  Interactive audio and video telecommunications were attempted between this nurse and patient, however failed, due to patient having technical difficulties OR patient did not have access to video capability.  We continued and completed visit with audio only.  Some vital signs may be absent or patient reported.   Criselda Peaches, LPN  Cardiac Risk Factors include: advanced age (>25men, >49 women);diabetes mellitus;male gender;hypertension;dyslipidemia     Objective:    Today's Vitals   05/10/22 1258  Weight: 198 lb (89.8 kg)  Height: 5' 8.9" (1.75 m)   Body mass index is 29.32 kg/m.     05/10/2022    1:07 PM  Advanced Directives  Does Patient Have a Medical Advance Directive? Yes  Type of Paramedic of [place];Living will  Copy of [place] in Chart? No - copy requested    Current Medications (verified) Outpatient Encounter Medications as of 05/10/2022  Medication Sig   amLODipine (NORVASC) 10 MG tablet TAKE 1 TABLET BY MOUTH DAILY   Blood Glucose Monitoring Suppl DEVI 1 each by Does not apply route in the morning, at noon, and at bedtime. May substitute to any manufacturer covered by patient's insurance.   Glucose Blood (BLOOD GLUCOSE TEST STRIPS) STRP  1 each by In Vitro route in the morning, at noon, and at bedtime. May substitute to any manufacturer covered by patient's insurance.   hydrochlorothiazide (HYDRODIURIL) 25 MG tablet TAKE 1 TABLET BY MOUTH DAILY   Lancet Device MISC 1 each by Does not apply route in the morning, at noon, and at bedtime. May substitute to any manufacturer covered by patient's insurance.   Lancets Misc. MISC 1 each by Does not apply route in the morning, at noon, and at bedtime. May substitute to any manufacturer covered by patient's insurance.   lisinopril (ZESTRIL) 40 MG tablet TAKE 1 TABLET BY MOUTH DAILY   metFORMIN (GLUCOPHAGE-XR) 750 MG 24 hr tablet TAKE ONE TABLET BY MOUTH DAILY WITH BREAKFAST   metoprolol succinate (TOPROL-XL) 50 MG 24 hr tablet TAKE 1 TABLET BY MOUTH DAILY IMMEDIATELY FOLLOWING A MEAL   metroNIDAZOLE (METROGEL) 0.75 % gel Apply 1 Application topically 2 (two) times daily.   Multiple Vitamin (MULTIVITAMIN) tablet Take 1 tablet by mouth daily.   potassium chloride SA (KLOR-CON M20) 20 MEQ tablet TAKE ONE TABLET BY MOUTH TWICE A DAY   rosuvastatin (CRESTOR) 10 MG tablet TAKE 1 TABLET BY MOUTH DAILY   No facility-administered encounter medications on file as of 05/10/2022.    Allergies (verified) Patient has no known allergies.   History: Past Medical History:  Diagnosis Date   Chest pain    Dyspnea    Hyperlipidemia    Hypertension    Metabolic syndrome    OSA (obstructive sleep apnea)    Sleep apnea    Past Surgical History:  Procedure Laterality  Date   BACK SURGERY  1992   SPINE SURGERY  1992   L4-5 discectomy   Family History  Problem Relation Age of Onset   Brain cancer Mother 19   Heart attack Mother 79   Hypertension Mother    Diabetes Mother    Heart disease Father 57   Colon cancer Father    Stroke Brother    Rectal cancer Neg Hx    Stomach cancer Neg Hx    Social History   Socioeconomic History   Marital status: Married    Spouse name: Pamala Hurry   Number  of children: N   Years of education: Not on file   Highest education level: Bachelor's degree (e.g., BA, AB, BS)  Occupational History   Occupation: route sales  Tobacco Use   Smoking status: Never   Smokeless tobacco: Never  Vaping Use   Vaping Use: Never used  Substance and Sexual Activity   Alcohol use: No   Drug use: No   Sexual activity: Not on file  Other Topics Concern   Not on file  Social History Narrative   Not on file   Social Determinants of Health   Financial Resource Strain: Low Risk  (05/10/2022)   Overall Financial Resource Strain (CARDIA)    Difficulty of Paying Living Expenses: Not hard at all  Food Insecurity: No Food Insecurity (05/10/2022)   Hunger Vital Sign    Worried About Running Out of Food in the Last Year: Never true    Ran Out of Food in the Last Year: Never true  Transportation Needs: No Transportation Needs (05/10/2022)   PRAPARE - Hydrologist (Medical): No    Lack of Transportation (Non-Medical): No  Physical Activity: Inactive (05/10/2022)   Exercise Vital Sign    Days of Exercise per Week: 0 days    Minutes of Exercise per Session: 0 min  Stress: No Stress Concern Present (05/10/2022)   [place]    Feeling of Stress : Not at all  Social Connections: Moderately Isolated (05/10/2022)   Social Connection and Isolation Panel [NHANES]    Frequency of Communication with Friends and Family: More than three times a week    Frequency of Social Gatherings with Friends and Family: More than three times a week    Attends Religious Services: Never    Marine scientist or Organizations: No    Attends Music therapist: Never    Marital Status: Married    Tobacco Counseling Counseling given: Not Answered   Clinical Intake:  Pre-visit preparation completed: Yes  Pain : No/denies pain     BMI - recorded: 29.32 Nutritional Risks:  None Diabetes: Yes CBG done?: No Did pt. bring in CBG monitor from home?: No  How often do you need to have someone help you when you read instructions, pamphlets, or other written materials from your doctor or pharmacy?: 1 - Never  Diabetic?  Yes  Interpreter Needed?: NoNutrition Risk Assessment:  Has the patient had any N/V/D within the last 2 months?  No  Does the patient have any non-healing wounds?  No  Has the patient had any unintentional weight loss or weight gain?  No   Diabetes:  Is the patient diabetic?  Yes  If diabetic, was a CBG obtained today?  No  Did the patient bring in their glucometer from home?  No  How often do you monitor your CBG's? PRN.  Financial Strains and Diabetes Management:  Are you having any financial strains with the device, your supplies or your medication? No .  Does the patient want to be seen by Chronic Care Management for management of their diabetes?  No  Would the patient like to be referred to a Nutritionist or for Diabetic Management?  No   Diabetic Exams:  Diabetic Eye Exam: Completed Yes. Overdue for diabetic eye exam. Pt has been advised about the importance in completing this exam. A referral has been placed today. Message sent to referral coordinator for scheduling purposes. Advised pt to expect a call from office referred to regarding appt.  Diabetic Foot Exam: Completed Yes. Pt has been advised about the importance in completing this exam. Pt is scheduled for diabetic foot exam on Followed by PCP.    Information entered by :: Rolene Arbour LPN   Activities of Daily Living    05/10/2022    1:05 PM 05/09/2022    8:25 PM  In your present state of health, do you have any difficulty performing the following activities:  Hearing? 0 0  Vision? 0 0  Difficulty concentrating or making decisions? 0 0  Walking or climbing stairs? 0 0  Dressing or bathing? 0 0  Doing errands, shopping? 0 0  Preparing Food and eating ? N N  Using  the Toilet? N N  In the past six months, have you accidently leaked urine? N Y  Do you have problems with loss of bowel control? N N  Managing your Medications? N N  Managing your Finances? N N  Housekeeping or managing your Housekeeping? N N    Patient Care Team: [name], MD as PCP - General (Family Medicine)  Indicate any recent Medical Services you may have received from other than [facility] providers in the past year (date may be approximate).     Assessment:   This is a routine wellness examination for Tyrone Jefferson.  Hearing/Vision screen Hearing Screening - Comments:: Denies hearing difficulties   Vision Screening - Comments:: Wears rx glasses - up to date with routine eye exams with  Dr Laurence Aly  Dietary issues and exercise activities discussed: Current Exercise Habits: The patient does not participate in regular exercise at present, Exercise limited by: None identified   Goals Addressed               This Visit's Progress     Stay Healthy (pt-stated)         Depression Screen    05/10/2022    1:05 PM 01/24/2022    8:35 AM 08/25/2021    9:38 AM 01/11/2021   11:45 AM 01/13/2020   10:39 AM 11/11/2018    2:18 PM 09/19/2017    3:15 PM  PHQ 2/9 Scores  PHQ - 2 Score 0 0 0 0 0 0 0  PHQ- 9 Score   0  0 0     Fall Risk    05/10/2022    1:06 PM 05/09/2022    8:25 PM 04/21/2022   11:23 PM 01/24/2022    8:35 AM 08/25/2021    9:39 AM  [place] in the past year? 0 0 0 0 0  Number falls in past yr: 0   0 0  Injury with Fall? 0   0 0  Risk for fall due to : No Fall Risks   No Fall Risks No Fall Risks  Follow up Falls prevention discussed   Falls evaluation completed  Falls evaluation completed    FALL RISK PREVENTION PERTAINING TO THE HOME:  Any stairs in or around the home? No If so, are there any without handrails? No  Home free of loose throw rugs in walkways, pet beds, electrical cords, etc? Yes  Adequate lighting in your home to reduce risk of falls?  Yes   ASSISTIVE DEVICES UTILIZED TO PREVENT FALLS:  Life alert? No  Use of a cane, walker or w/c? No  Grab bars in the bathroom? No  Shower chair or bench in shower? No  Elevated toilet seat or a handicapped toilet? No   TIMED UP AND GO:  Was the test performed? No . Audio Visit   Cognitive Function:        05/10/2022    1:07 PM  6CIT Screen  What Year? 0 points  What month? 0 points  What time? 0 points  Count back from 20 0 points  Months in reverse 0 points  Repeat phrase 0 points  Total Score 0 points    Immunizations Immunization History  Administered Date(s) Administered   Influenza,inj,Quad PF,6+ Mos 11/11/2018   PFIZER(Purple Top)SARS-COV-2 Vaccination 05/24/2019, 06/14/2019, 01/26/2020   PNEUMOCOCCAL CONJUGATE-20 01/24/2022   Tdap 10/12/2011    TDAP status: Due, Education has been provided regarding the importance of this vaccine. Advised may receive this vaccine at local pharmacy or Health Dept. Aware to provide a copy of the vaccination record if obtained from local pharmacy or Health Dept. Verbalized acceptance and understanding.  Flu Vaccine status: Declined, Education has been provided regarding the importance of this vaccine but patient still declined. Advised may receive this vaccine at local pharmacy or Health Dept. Aware to provide a copy of the vaccination record if obtained from local pharmacy or Health Dept. Verbalized acceptance and understanding.  Pneumococcal vaccine status: Up to date  Covid-19 vaccine status: Completed vaccines  Qualifies for Shingles Vaccine? Yes   Zostavax completed No   Shingrix Completed?: No.    Education has been provided regarding the importance of this vaccine. Patient has been advised to call insurance company to determine out of pocket expense if they have not yet received this vaccine. Advised may also receive vaccine at local pharmacy or Health Dept. Verbalized acceptance and understanding.  Screening  Tests Health Maintenance  Topic Date Due   FOOT EXAM  Never done   DTaP/Tdap/Td (2 - Td or Tdap) 10/11/2021   INFLUENZA VACCINE  05/21/2022 (Originally 09/20/2021)   COVID-19 Vaccine (4 - 2023-24 season) 05/26/2022 (Originally 10/21/2021)   Zoster Vaccines- Shingrix (1 of 2) 08/10/2022 (Originally 04/05/2006)   OPHTHALMOLOGY EXAM  05/15/2022   HEMOGLOBIN A1C  10/26/2022   Diabetic kidney evaluation - eGFR measurement  01/25/2023   Diabetic kidney evaluation - Urine ACR  01/25/2023   Medicare Annual Wellness (AWV)  05/10/2023   COLONOSCOPY (Pts 45-92yrs Insurance coverage will need to be confirmed)  01/02/2024   Pneumonia Vaccine 25+ Years old  Completed   Hepatitis C Screening  Completed   HPV VACCINES  Aged Out    Health Maintenance  Health Maintenance Due  Topic Date Due   FOOT EXAM  Never done   DTaP/Tdap/Td (2 - Td or Tdap) 10/11/2021    Colorectal cancer screening: Type of screening: Colonoscopy. Completed 01/02/19. Repeat every 5 years  Lung Cancer Screening: (Low Dose CT Chest recommended if Age 26-80 years, 30 pack-year currently smoking OR have quit w/in 15years.) does not qualify.     Additional Screening:  Hepatitis C Screening:  does qualify; Completed 09/19/17  Vision Screening: Recommended annual ophthalmology exams for early detection of glaucoma and other disorders of the eye. Is the patient up to date with their annual eye exam?  Yes  Who is the provider or what is the name of the office in which the patient attends annual eye exams? Dr Laurence Aly If pt is not established with a provider, would they like to be referred to a provider to establish care? No .   Dental Screening: Recommended annual dental exams for proper oral hygiene  Community Resource Referral / Chronic Care Management:  CRR required this visit?  No   CCM required this visit?  No      Plan:     I have personally reviewed and noted the following in the patient's chart:   Medical and  social history Use of alcohol, tobacco or illicit drugs  Current medications and supplements including opioid prescriptions. Patient is not currently taking opioid prescriptions. Functional ability and status Nutritional status Physical activity Advanced directives List of other physicians Hospitalizations, surgeries, and ER visits in previous 12 months Vitals Screenings to include cognitive, depression, and falls Referrals and appointments  In addition, I have reviewed and discussed with patient certain preventive protocols, quality metrics, and best practice recommendations. A written personalized care plan for preventive services as well as general preventive health recommendations were provided to patient.     Criselda Peaches, LPN   [id number]   Nurse Notes: None

## 2022-06-03 DIAGNOSIS — G4733 Obstructive sleep apnea (adult) (pediatric): Secondary | ICD-10-CM | POA: Diagnosis not present

## 2022-06-24 ENCOUNTER — Other Ambulatory Visit: Payer: Self-pay | Admitting: Family Medicine

## 2022-06-24 DIAGNOSIS — I10 Essential (primary) hypertension: Secondary | ICD-10-CM

## 2022-07-04 DIAGNOSIS — G4733 Obstructive sleep apnea (adult) (pediatric): Secondary | ICD-10-CM | POA: Diagnosis not present

## 2022-07-07 ENCOUNTER — Other Ambulatory Visit: Payer: Self-pay | Admitting: Family Medicine

## 2022-07-07 DIAGNOSIS — E119 Type 2 diabetes mellitus without complications: Secondary | ICD-10-CM | POA: Diagnosis not present

## 2022-07-07 DIAGNOSIS — H524 Presbyopia: Secondary | ICD-10-CM | POA: Diagnosis not present

## 2022-07-07 LAB — HM DIABETES EYE EXAM

## 2022-08-07 DIAGNOSIS — K080 Exfoliation of teeth due to systemic causes: Secondary | ICD-10-CM | POA: Diagnosis not present

## 2022-08-26 ENCOUNTER — Other Ambulatory Visit: Payer: Self-pay | Admitting: Family Medicine

## 2022-09-26 ENCOUNTER — Other Ambulatory Visit: Payer: Self-pay | Admitting: Family Medicine

## 2022-10-03 ENCOUNTER — Encounter: Payer: Self-pay | Admitting: Family Medicine

## 2022-10-03 ENCOUNTER — Ambulatory Visit (INDEPENDENT_AMBULATORY_CARE_PROVIDER_SITE_OTHER): Payer: Medicare Other | Admitting: Family Medicine

## 2022-10-03 VITALS — BP 138/64 | HR 70 | Temp 97.9°F | Ht 68.9 in | Wt 203.1 lb

## 2022-10-03 DIAGNOSIS — K644 Residual hemorrhoidal skin tags: Secondary | ICD-10-CM | POA: Diagnosis not present

## 2022-10-03 NOTE — Patient Instructions (Signed)
Drink lots of fluids  Try to get at least 30 grams of fiber daily  Consider short term use of stool softener such as Colace to avoid straining.  Consider use of topical such as Anusol HC or Preparation H  Do warm water tub bath 20 minutes or so.

## 2022-10-03 NOTE — Progress Notes (Signed)
Established Patient Office Visit  Subjective   Patient ID: Tyrone Jefferson, male    DOB: 05-Sep-1955  Age: 67 y.o. MRN: [id number]  Chief Complaint  Patient presents with   Hemorrhoids    HPI   Tyrone Jefferson is seen with possible hemorhoid(s).   He states about 6 to 8 months ago he noticed a small "bump "perianal region which was nonpainful.  About 2 weeks ago he noticed some substantial swelling.  Occasionally strains with bowel movement but not regularly.  No associated pain.  5 days ago he noticed just a couple of very small specks of blood with wiping.  He had colonoscopy 11/20 which showed some internal hemorrhoids.  He is not aware of any prior history of external hemorrhoids.  No pain with bowel movements.  Past Medical History:  Diagnosis Date   Chest pain    Dyspnea    Hyperlipidemia    Hypertension    Metabolic syndrome    OSA (obstructive sleep apnea)    Sleep apnea    Past Surgical History:  Procedure Laterality Date   BACK SURGERY  1992   SPINE SURGERY  1992   L4-5 discectomy    reports that he has never smoked. He has never used smokeless tobacco. He reports that he does not drink alcohol and does not use drugs. family history includes Brain cancer (age of onset: 48) in his mother; Colon cancer in his father; Diabetes in his mother; Heart attack (age of onset: 94) in his mother; Heart disease (age of onset: 70) in his father; Hypertension in his mother; Stroke in his brother. No Known Allergies  Review of Systems  Constitutional:  Negative for chills, fever and weight loss.  Gastrointestinal:  Negative for abdominal pain, diarrhea and melena.      Objective:     BP 138/64 (BP Location: Left Arm, Cuff Size: Normal)   Pulse 70   Temp 97.9 F (36.6 C) (Oral)   Ht 5' 8.9" (1.75 m)   Wt 203 lb 1.6 oz (92.1 kg)   SpO2 98%   BMI 30.08 kg/m  BP Readings from Last 3 Encounters:  10/03/22 138/64  04/25/22 134/66  01/24/22 120/60   Wt Readings from Last 3  Encounters:  10/03/22 203 lb 1.6 oz (92.1 kg)  05/10/22 198 lb (89.8 kg)  04/25/22 198 lb 14.4 oz (90.2 kg)      Physical Exam Vitals reviewed.  Constitutional:      Appearance: Normal appearance.  Cardiovascular:     Rate and Rhythm: Normal rate and regular rhythm.  Genitourinary:    Comments: Annual exam reveals approximate 1 cm external hemorrhoid around the 1 to 2 o'clock position.  Nontender to palpation.  No active bleeding noted at this time. Neurological:     Mental Status: He is alert.      No results found for any visits on 10/03/22.  Last CBC Lab Results  Component Value Date   WBC 8.8 01/24/2022   HGB 15.9 01/24/2022   HCT 45.0 01/24/2022   MCV 83.9 01/24/2022   RDW 13.2 01/24/2022   PLT 305.0 01/24/2022   Last metabolic panel Lab Results  Component Value Date   GLUCOSE 135 (H) 01/24/2022   NA 140 01/24/2022   K 3.4 (L) 01/24/2022   CL 95 (L) 01/24/2022   CO2 35 (H) 01/24/2022   BUN 16 01/24/2022   CREATININE 0.92 01/24/2022   GFR 87.11 01/24/2022   CALCIUM 9.1 01/24/2022   PROT 7.4 01/24/2022  ALBUMIN 4.8 01/24/2022   BILITOT 0.6 01/24/2022   ALKPHOS 71 01/24/2022   AST 13 01/24/2022   ALT 13 01/24/2022   Last lipids Lab Results  Component Value Date   CHOL 111 01/24/2022   HDL 37.10 (L) 01/24/2022   LDLCALC 42 01/24/2022   LDLDIRECT 69.0 04/20/2021   TRIG 163.0 (H) 01/24/2022   CHOLHDL 3 01/24/2022   Last hemoglobin A1c Lab Results  Component Value Date   HGBA1C 7.0 (A) 04/25/2022      The ASCVD Risk score (Arnett DK, et al., 2019) failed to calculate for the following reasons:   The valid total cholesterol range is 130 to 320 mg/dL    Assessment & Plan:   External hemorrhoid.  No acute thrombosis.  Minimal pain. -Discussed measures to reduce constipation with plenty of fluids and fiber and stool softener if necessary -Continue topical such as Preparation H or Anusol HC -Recommend sitz warm water baths daily -Follow-up for  any associated pain or other concerns   Evelena Peat, MD

## 2022-10-05 ENCOUNTER — Encounter (INDEPENDENT_AMBULATORY_CARE_PROVIDER_SITE_OTHER): Payer: Self-pay

## 2022-10-10 DIAGNOSIS — G4733 Obstructive sleep apnea (adult) (pediatric): Secondary | ICD-10-CM | POA: Diagnosis not present

## 2022-10-23 NOTE — Progress Notes (Unsigned)
[id number]- M never smoker followed for OSA, complicated by HTN, OSA, Obesity, Hyperlipidemia, Metabolic Syndrome, DM2,  NPSG 2/57/49- AHI 19.7/ hr, desaturation to 85%, body weight 200 lbs, CPAP to 9 HST-04/04/21- AHI 28.4/ hr, desaturation to 76%/median 88%, body weight   ========================================================================   10/20/21-  66 yoM never smoker followed for OSA, Insomnia complicated by HTN, OSA, Obesity, Hyperlipidemia, Metabolic Syndrome, DM2,  CPAP auto 5-20/ [employer]     AirSense10AutoSet        ordered 04/11/21 Download compliance- 77%, AHI 1.9/ hr Body weight today-200 lbs Covid vax-3 [employer] -----Pt f/u on for OSA he says his sleep has been okay. Getting approx 6-7hrs/night of sleep.  He and his wife are in separate rooms because his CPAP bothers her.  She thinks he is breathing quietly and sleeping better.  He says CPAP mask is comfortable now.  Download reviewed.  We did discuss alternatives to CPAP.  10/24/22- 67 yoM never smoker followed for OSA, Insomnia complicated by HTN, OSA, Obesity, Hyperlipidemia, Metabolic Syndrome, DM2,  CPAP auto 5-20/ [employer]     AirSense10AutoSet        ordered 04/11/21 Download compliance- 43%, AHI 3/hr Body weight today-203 lbs                           Wife here Download reviewed.  He gets up for bathroom during the night and does not bother to put mask back on.  We discussed options.  Looks as if H he dropped off on the dome beard e had an unsuccessful oral appliance from Dr. Toni Arthurs 6 or 8 years ago.  He is not interested in surgery.  We decided that he would continue working with CPAP as he currently has it.  I emphasized that I would be happy to look at alternatives.  He is comfortable with the mask he has after mask fitting at the sleep center. I am never surprised if he does not show up on ROS-see HPI   + = positive Constitutional:    weight loss, night sweats, fevers, chills, fatigue, lassitude. HEENT:    headaches,  difficulty swallowing, tooth/dental problems, sore throat,       sneezing, itching, ear ache, nasal congestion, post nasal drip, snoring CV:    chest pain, orthopnea, PND, swelling in lower extremities, anasarca,                                   dizziness, palpitations Resp:   shortness of breath with exertion or at rest.                productive cough,   non-productive cough, coughing up of blood.              change in color of mucus.  wheezing.   Skin:    rash or lesions. GI:  No-   heartburn, indigestion, abdominal pain, nausea, vomiting, diarrhea,                 change in bowel habits, loss of appetite GU: dysuria, change in color of urine, no urgency or frequency.   flank pain. MS:   joint pain, stiffness, decreased range of motion, back pain. Neuro-     nothing unusual Psych:  change in mood or affect.  depression or anxiety.   memory loss.  OBJ- Physical Exam General- Alert, Oriented, Affect-appropriate, Distress- none acute Skin-  rash-none, lesions- none, excoriation- none Lymphadenopathy- none Head- atraumatic            Eyes- Gross vision intact, PERRLA, conjunctivae and secretions clear            Ears- Hearing, canals-normal            Nose- Clear, no-Septal dev, mucus, polyps, erosion, perforation             Throat- Mallampati IV , mucosa clear , drainage- none, tonsils- atrophic, + teeth Neck- flexible , trachea midline, no stridor , thyroid nl, carotid no bruit Chest - symmetrical excursion , unlabored           Heart/CV- RRR , no murmur , no gallop  , no rub, nl s1 s2                           - JVD- none , edema- none, stasis changes- none, varices- none           Lung- clear to P&A, wheeze- none, cough- none , dullness-none, rub- none           Chest wall-  Abd-  Br/ Gen/ Rectal- Not done, not indicated Extrem- cyanosis- none, clubbing, none, atrophy- none, strength- nl Neuro- grossly intact to observation

## 2022-10-24 ENCOUNTER — Ambulatory Visit: Payer: Medicare Other | Admitting: Internal Medicine

## 2022-10-24 ENCOUNTER — Encounter: Payer: Self-pay | Admitting: Internal Medicine

## 2022-10-24 VITALS — BP 130/72 | HR 63 | Temp 97.4°F | Ht 69.0 in | Wt 203.0 lb

## 2022-10-24 DIAGNOSIS — G4733 Obstructive sleep apnea (adult) (pediatric): Secondary | ICD-10-CM

## 2022-10-24 DIAGNOSIS — F5101 Primary insomnia: Secondary | ICD-10-CM

## 2022-10-24 NOTE — Patient Instructions (Signed)
Ok to keep working with CPAP- try different masks  Ok to send you to other treatment options

## 2022-10-24 NOTE — Assessment & Plan Note (Signed)
He has moderate to severe OSA.  I emphasized to her that it was up to him to make the decisions about whether and how he wanted to be treated. Plan-continue CPAP but consider looking into options.

## 2022-10-24 NOTE — Assessment & Plan Note (Signed)
He mostly describes waking for bathroom then coming back to bed and falling asleep quickly.

## 2022-10-27 ENCOUNTER — Ambulatory Visit: Payer: Medicare Other | Admitting: Family Medicine

## 2022-10-30 ENCOUNTER — Encounter: Payer: Self-pay | Admitting: Family Medicine

## 2022-10-30 ENCOUNTER — Ambulatory Visit (INDEPENDENT_AMBULATORY_CARE_PROVIDER_SITE_OTHER): Payer: Medicare Other | Admitting: Family Medicine

## 2022-10-30 VITALS — BP 130/70 | HR 60 | Temp 97.8°F | Ht 69.0 in | Wt 199.9 lb

## 2022-10-30 DIAGNOSIS — E1165 Type 2 diabetes mellitus with hyperglycemia: Secondary | ICD-10-CM | POA: Diagnosis not present

## 2022-10-30 DIAGNOSIS — Z7984 Long term (current) use of oral hypoglycemic drugs: Secondary | ICD-10-CM | POA: Diagnosis not present

## 2022-10-30 DIAGNOSIS — E785 Hyperlipidemia, unspecified: Secondary | ICD-10-CM

## 2022-10-30 DIAGNOSIS — I10 Essential (primary) hypertension: Secondary | ICD-10-CM

## 2022-10-30 LAB — POCT GLYCOSYLATED HEMOGLOBIN (HGB A1C): Hemoglobin A1C: 6.8 % — AB (ref 4.0–5.6)

## 2022-10-30 NOTE — Progress Notes (Signed)
Established Patient Office Visit  Subjective   Patient ID: Tyrone Jefferson, male    DOB: 29-Jun-1955  Age: 67 y.o. MRN: [id number]  Chief Complaint  Patient presents with   Medical Management of Chronic Issues    HPI   [name] is seen today for 13-month follow-up.  He has history of hypertension, obstructive sleep apnea, type 2 diabetes, dyslipidemia. Generally doing well.  Medications reviewed.  Compliant with all.  Had previous intolerance with Lipitor and simvastatin but tolerating rosuvastatin without difficulty.  His blood sugars have been improving.  However, he still has some intermittent diarrhea usually about once per week and we explained this could be related to metformin.  Does take extended release metformin.  His other medications include rosuvastatin, lisinopril, amlodipine, hydrochlorothiazide, metoprolol, and potassium supplement.  Denies any recent chest pains.  Appetite and weight stable  Past Medical History:  Diagnosis Date   Chest pain    Dyspnea    Hyperlipidemia    Hypertension    Metabolic syndrome    OSA (obstructive sleep apnea)    Sleep apnea    Past Surgical History:  Procedure Laterality Date   BACK SURGERY  1992   SPINE SURGERY  1992   L4-5 discectomy    reports that he has never smoked. He has never used smokeless tobacco. He reports that he does not drink alcohol and does not use drugs. family history includes Brain cancer (age of onset: 37) in his mother; Colon cancer in his father; Diabetes in his mother; Heart attack (age of onset: 36) in his mother; Heart disease (age of onset: 70) in his father; Hypertension in his mother; Stroke in his brother. No Known Allergies  Review of Systems  Constitutional:  Negative for malaise/fatigue.  Eyes:  Negative for blurred vision.  Respiratory:  Negative for shortness of breath.   Cardiovascular:  Negative for chest pain.  Neurological:  Negative for dizziness, weakness and headaches.       Objective:     BP 130/70 (BP Location: Left Arm, Patient Position: Sitting, Cuff Size: Normal)   Pulse 60   Temp 97.8 F (36.6 C) (Oral)   Ht 5\' 9"  (1.753 m)   Wt 199 lb 14.4 oz (90.7 kg)   SpO2 97%   BMI 29.52 kg/m  BP Readings from Last 3 Encounters:  10/30/22 130/70  10/24/22 130/72  10/03/22 138/64   Wt Readings from Last 3 Encounters:  10/30/22 199 lb 14.4 oz (90.7 kg)  10/24/22 203 lb (92.1 kg)  10/03/22 203 lb 1.6 oz (92.1 kg)      Physical Exam Vitals reviewed.  Constitutional:      Appearance: He is well-developed.  HENT:     Right Ear: External ear normal.     Left Ear: External ear normal.  Eyes:     Pupils: Pupils are equal, round, and reactive to light.  Neck:     Thyroid: No thyromegaly.  Cardiovascular:     Rate and Rhythm: Normal rate and regular rhythm.  Pulmonary:     Effort: Pulmonary effort is normal. No respiratory distress.     Breath sounds: Normal breath sounds. No wheezing or rales.  Musculoskeletal:     Cervical back: Neck supple.     Right lower leg: No edema.     Left lower leg: No edema.  Neurological:     Mental Status: He is alert and oriented to person, place, and time.      Results for orders placed  or performed in visit on 10/30/22  POC HgB A1c  Result Value Ref Range   Hemoglobin A1C 6.8 (A) 4.0 - 5.6 %   HbA1c POC (<> result, manual entry)     HbA1c, POC (prediabetic range)     HbA1c, POC (controlled diabetic range)      Last CBC Lab Results  Component Value Date   WBC 8.8 01/24/2022   HGB 15.9 01/24/2022   HCT 45.0 01/24/2022   MCV 83.9 01/24/2022   RDW 13.2 01/24/2022   PLT 305.0 01/24/2022   Last metabolic panel Lab Results  Component Value Date   GLUCOSE 135 (H) 01/24/2022   NA 140 01/24/2022   K 3.4 (L) 01/24/2022   CL 95 (L) 01/24/2022   CO2 35 (H) 01/24/2022   BUN 16 01/24/2022   CREATININE 0.92 01/24/2022   GFR 87.11 01/24/2022   CALCIUM 9.1 01/24/2022   PROT 7.4 01/24/2022   ALBUMIN 4.8  01/24/2022   BILITOT 0.6 01/24/2022   ALKPHOS 71 01/24/2022   AST 13 01/24/2022   ALT 13 01/24/2022   Last lipids Lab Results  Component Value Date   CHOL 111 01/24/2022   HDL 37.10 (L) 01/24/2022   LDLCALC 42 01/24/2022   LDLDIRECT 69.0 04/20/2021   TRIG 163.0 (H) 01/24/2022   CHOLHDL 3 01/24/2022   Last hemoglobin A1c Lab Results  Component Value Date   HGBA1C 6.8 (A) 10/30/2022   Last thyroid functions Lab Results  Component Value Date   TSH 4.25 12/21/2020      The ASCVD Risk score (Arnett DK, et al., 2019) failed to calculate for the following reasons:   The valid total cholesterol range is 130 to 320 mg/dL    Assessment & Plan:   #1 type 2 diabetes stable with A1c today 6.8%.  Patient is having some occasional diarrhea which we explained may be related to his metformin extended release.  We did suggest he check on coverage for SGLT2 medication such as [place] or [place].  If we can get these covered would replace the metformin with 1 of those.  He will be back in touch  #2 hypertension stable and well-controlled on multidrug regimen including lisinopril, amlodipine, hydrochlorothiazide, and metoprolol.  Continue current regimen.  Continue regular aerobic exercise and sodium reduction  #3 hyperlipidemia treated with rosuvastatin.  Check fasting lipid panel at 72-month follow-up along with blood chemistries   Return in about 3 months (around 01/29/2023).    Evelena Peat, MD

## 2022-10-30 NOTE — Patient Instructions (Addendum)
See if your insurance will cover Tyrone Jefferson and Tyrone Jefferson in place of the Metformin.    A1C today controlled at 6.8.    Set up follow up in 3 to 4 months.

## 2022-11-01 ENCOUNTER — Ambulatory Visit: Payer: Medicare Other | Admitting: Family Medicine

## 2022-11-10 DIAGNOSIS — G4733 Obstructive sleep apnea (adult) (pediatric): Secondary | ICD-10-CM | POA: Diagnosis not present

## 2022-12-10 DIAGNOSIS — G4733 Obstructive sleep apnea (adult) (pediatric): Secondary | ICD-10-CM | POA: Diagnosis not present

## 2022-12-18 ENCOUNTER — Other Ambulatory Visit: Payer: Self-pay | Admitting: Family Medicine

## 2022-12-18 DIAGNOSIS — I10 Essential (primary) hypertension: Secondary | ICD-10-CM

## 2023-01-02 ENCOUNTER — Other Ambulatory Visit: Payer: Self-pay | Admitting: Family Medicine

## 2023-01-11 DIAGNOSIS — G4733 Obstructive sleep apnea (adult) (pediatric): Secondary | ICD-10-CM | POA: Diagnosis not present

## 2023-01-29 ENCOUNTER — Ambulatory Visit (INDEPENDENT_AMBULATORY_CARE_PROVIDER_SITE_OTHER): Payer: Medicare Other | Admitting: Family Medicine

## 2023-01-29 VITALS — BP 116/60 | HR 65 | Temp 97.9°F | Ht 69.0 in | Wt 199.0 lb

## 2023-01-29 DIAGNOSIS — I499 Cardiac arrhythmia, unspecified: Secondary | ICD-10-CM

## 2023-01-29 DIAGNOSIS — E785 Hyperlipidemia, unspecified: Secondary | ICD-10-CM | POA: Diagnosis not present

## 2023-01-29 DIAGNOSIS — E1165 Type 2 diabetes mellitus with hyperglycemia: Secondary | ICD-10-CM | POA: Diagnosis not present

## 2023-01-29 DIAGNOSIS — Z125 Encounter for screening for malignant neoplasm of prostate: Secondary | ICD-10-CM | POA: Diagnosis not present

## 2023-01-29 DIAGNOSIS — Z7984 Long term (current) use of oral hypoglycemic drugs: Secondary | ICD-10-CM

## 2023-01-29 DIAGNOSIS — I10 Essential (primary) hypertension: Secondary | ICD-10-CM | POA: Diagnosis not present

## 2023-01-29 DIAGNOSIS — R972 Elevated prostate specific antigen [PSA]: Secondary | ICD-10-CM

## 2023-01-29 LAB — COMPREHENSIVE METABOLIC PANEL
ALT: 11 U/L (ref 0–53)
AST: 14 U/L (ref 0–37)
Albumin: 4.6 g/dL (ref 3.5–5.2)
Alkaline Phosphatase: 64 U/L (ref 39–117)
BUN: 17 mg/dL (ref 6–23)
CO2: 33 meq/L — ABNORMAL HIGH (ref 19–32)
Calcium: 9 mg/dL (ref 8.4–10.5)
Chloride: 97 meq/L (ref 96–112)
Creatinine, Ser: 0.92 mg/dL (ref 0.40–1.50)
GFR: 86.5 mL/min (ref >60.00)
Glucose, Bld: 135 mg/dL — ABNORMAL HIGH (ref 70–99)
Potassium: 3.3 meq/L — ABNORMAL LOW (ref 3.5–5.1)
Sodium: 140 meq/L (ref 135–145)
Total Bilirubin: 0.8 mg/dL (ref 0.2–1.2)
Total Protein: 7.2 g/dL (ref 6.0–8.3)

## 2023-01-29 LAB — LIPID PANEL
Cholesterol: 107 mg/dL (ref 0–200)
HDL: 35.3 mg/dL — ABNORMAL LOW (ref >39.00)
LDL Cholesterol: 45 mg/dL (ref 0–99)
NonHDL: 72.19
Total CHOL/HDL Ratio: 3
Triglycerides: 137 mg/dL (ref 0.0–149.0)
VLDL: 27.4 mg/dL (ref 0.0–40.0)

## 2023-01-29 LAB — POCT GLYCOSYLATED HEMOGLOBIN (HGB A1C): Hemoglobin A1C: 6.7 % — AB (ref 4.0–5.6)

## 2023-01-29 LAB — MICROALBUMIN / CREATININE URINE RATIO
Creatinine,U: 244.7 mg/dL
Microalb Creat Ratio: 3.5 mg/g (ref 0.0–30.0)
Microalb, Ur: 8.5 mg/dL — ABNORMAL HIGH (ref 0.0–1.9)

## 2023-01-29 LAB — PSA, MEDICARE: PSA: 3.1 ng/mL (ref 0.10–4.00)

## 2023-01-29 NOTE — Patient Instructions (Signed)
A1C today improved to 6.7%  Keep up the good work.   Let's plan on 6 month follow up.

## 2023-01-29 NOTE — Progress Notes (Signed)
Established Patient Office Visit  Subjective   Patient ID: ROI HJORT, male    DOB: Apr 08, 1955  Age: 68 y.o. MRN: [id number]  No chief complaint on file.   HPI   Tyrone Jefferson has history of obesity, hypertension, obstructive sleep apnea, type 2 diabetes, dyslipidemia, metabolic syndrome.  Generally doing well.  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  Does some walking but not consistently.  His medications include rosuvastatin, metoprolol, metformin, lisinopril, HCTZ, potassium supplement, and amlodipine.  Compliant with medications.  Denies any recent dizziness or headaches.  No chest pains.  Poor compliance with diet recently especially over Thanksgiving.  Past Medical History:  Diagnosis Date   Chest pain    Dyspnea    Hyperlipidemia    Hypertension    Metabolic syndrome    OSA (obstructive sleep apnea)    Sleep apnea    Past Surgical History:  Procedure Laterality Date   BACK SURGERY  1992   SPINE SURGERY  1992   L4-5 discectomy    reports that he has never smoked. He has never used smokeless tobacco. He reports that he does not drink alcohol and does not use drugs. family history includes Brain cancer (age of onset: 7) in his mother; Colon cancer in his father; Diabetes in his mother; Heart attack (age of onset: 45) in his mother; Heart disease (age of onset: 60) in his father; Hypertension in his mother; Stroke in his brother. No Known Allergies  Review of Systems  Constitutional:  Negative for malaise/fatigue and weight loss.  Eyes:  Negative for blurred vision.  Respiratory:  Negative for shortness of breath.   Cardiovascular:  Negative for chest pain and palpitations.  Neurological:  Negative for dizziness, loss of consciousness, weakness and headaches.      Objective:     BP 116/60 (BP Location: Left Arm, Patient Position: Sitting, Cuff Size: Large)   Pulse 65   Temp 97.9 F (36.6 C) (Oral)   Ht 5\' 9"  (1.753 m)   Wt 199 lb  (90.3 kg)   SpO2 96%   BMI 29.39 kg/m  BP Readings from Last 3 Encounters:  01/29/23 116/60  10/30/22 130/70  10/24/22 130/72   Wt Readings from Last 3 Encounters:  01/29/23 199 lb (90.3 kg)  10/30/22 199 lb 14.4 oz (90.7 kg)  10/24/22 203 lb (92.1 kg)      Physical Exam Vitals reviewed.  Constitutional:      General: He is not in acute distress.    Appearance: He is not ill-appearing or toxic-appearing.  HENT:     Head: Normocephalic and atraumatic.  Cardiovascular:     Rate and Rhythm: Normal rate.     Comments: He has occasional skipped beats. Pulmonary:     Effort: Pulmonary effort is normal.     Breath sounds: Normal breath sounds. No wheezing or rales.  Musculoskeletal:     Right lower leg: No edema.     Left lower leg: No edema.  Skin:    Comments: Feet reveal no skin lesions. Good distal foot pulses. Good capillary refill. No calluses. Normal sensation with monofilament testing   Neurological:     Mental Status: He is alert.      Results for orders placed or performed in visit on 01/29/23  POC HgB A1c  Result Value Ref Range   Hemoglobin A1C 6.7 (A) 4.0 - 5.6 %   HbA1c POC (<> result, manual entry)  HbA1c, POC (prediabetic range)     HbA1c, POC (controlled diabetic range)      Last CBC Lab Results  Component Value Date   WBC 8.8 01/24/2022   HGB 15.9 01/24/2022   HCT 45.0 01/24/2022   MCV 83.9 01/24/2022   RDW 13.2 01/24/2022   PLT 305.0 01/24/2022   Last metabolic panel Lab Results  Component Value Date   GLUCOSE 135 (H) 01/24/2022   NA 140 01/24/2022   K 3.4 (L) 01/24/2022   CL 95 (L) 01/24/2022   CO2 35 (H) 01/24/2022   BUN 16 01/24/2022   CREATININE 0.92 01/24/2022   GFR 87.11 01/24/2022   CALCIUM 9.1 01/24/2022   PROT 7.4 01/24/2022   ALBUMIN 4.8 01/24/2022   BILITOT 0.6 01/24/2022   ALKPHOS 71 01/24/2022   AST 13 01/24/2022   ALT 13 01/24/2022   Last lipids Lab Results  Component Value Date   CHOL 111 01/24/2022   HDL  37.10 (L) 01/24/2022   LDLCALC 42 01/24/2022   LDLDIRECT 69.0 04/20/2021   TRIG 163.0 (H) 01/24/2022   CHOLHDL 3 01/24/2022   Last hemoglobin A1c Lab Results  Component Value Date   HGBA1C 6.7 (A) 01/29/2023      The ASCVD Risk score (Arnett DK, et al., 2019) failed to calculate for the following reasons:   The valid total cholesterol range is 130 to 320 mg/dL    Assessment & Plan:   #1 type 2 diabetes stable with A1c today 6.7%.  Continue metformin and continue low glycemic diet.  We have challenged him to try to step up his exercise and lose a few pounds.  Continue yearly diabetic eye exam.  Check urine microalbumin screen today -We encouraged him to make it a goal of minimum 150 minutes of moderate intensity aerobic exercise such as walking per week and hopefully build up on this.  #2 hypertension stable and well-controlled.  Continue amlodipine, HCTZ, metoprolol, and lisinopril.  Check comprehensive metabolic panel  #3 dyslipidemia.  Continue rosuvastatin 10 mg daily.  Check fasting lipids today.  Continue with low saturated fat diet.   #4 irregular rhythm noted on exam.  Has occasional skipped beats. Check EKG. no EKG on record since 2015 EKG shows NSR.   Q waves V1 and V2 are noted on prior tracing 2015.    #5 history of OSA-followed per pulmonary  Set up 69-month follow-up   No follow-ups on file.    Evelena Peat, MD

## 2023-01-30 ENCOUNTER — Telehealth: Payer: Self-pay | Admitting: Family Medicine

## 2023-01-30 NOTE — Telephone Encounter (Signed)
Pt called, returning CMA's call. CMA was with a patient. Pt asked that CMA call back at his earliest convenience. 

## 2023-01-30 NOTE — Telephone Encounter (Signed)
Please see result note 

## 2023-01-30 NOTE — Addendum Note (Signed)
Addended by: Christy Sartorius on: 01/30/2023 09:42 AM   Modules accepted: Orders

## 2023-02-10 DIAGNOSIS — G4733 Obstructive sleep apnea (adult) (pediatric): Secondary | ICD-10-CM | POA: Diagnosis not present

## 2023-02-22 ENCOUNTER — Other Ambulatory Visit: Payer: Self-pay | Admitting: Family Medicine

## 2023-03-13 DIAGNOSIS — G4733 Obstructive sleep apnea (adult) (pediatric): Secondary | ICD-10-CM | POA: Diagnosis not present

## 2023-03-27 DIAGNOSIS — K080 Exfoliation of teeth due to systemic causes: Secondary | ICD-10-CM | POA: Diagnosis not present

## 2023-03-29 ENCOUNTER — Other Ambulatory Visit: Payer: Self-pay | Admitting: Family Medicine

## 2023-04-16 DIAGNOSIS — G4733 Obstructive sleep apnea (adult) (pediatric): Secondary | ICD-10-CM | POA: Diagnosis not present

## 2023-05-14 ENCOUNTER — Encounter: Payer: Self-pay | Admitting: Family Medicine

## 2023-05-14 DIAGNOSIS — E1165 Type 2 diabetes mellitus with hyperglycemia: Secondary | ICD-10-CM

## 2023-05-21 ENCOUNTER — Ambulatory Visit: Payer: Medicare Other

## 2023-05-21 VITALS — Ht 69.0 in | Wt 199.0 lb

## 2023-05-21 DIAGNOSIS — Z0000 Encounter for general adult medical examination without abnormal findings: Secondary | ICD-10-CM | POA: Diagnosis not present

## 2023-05-21 NOTE — Patient Instructions (Addendum)
 [name] , Thank you for taking time to come for your Medicare Wellness Visit. I appreciate your ongoing commitment to your health goals. Please review the following plan we discussed and let me know if I can assist you in the future.   Referrals/Orders/Follow-Ups/Clinician Recommendations:   This is a list of the screening recommended for you and due dates:  Health Maintenance  Topic Date Due   Complete foot exam   Never done   Zoster (Shingles) Vaccine (1 of 2) Never done   DTaP/Tdap/Td vaccine (2 - Td or Tdap) 10/11/2021   COVID-19 Vaccine (4 - 2024-25 season) 10/22/2022   Flu Shot  05/21/2023*   Eye exam for diabetics  07/07/2023   Hemoglobin A1C  07/30/2023   Colon Cancer Screening  01/02/2024   Yearly kidney function blood test for diabetes  01/29/2024   Yearly kidney health urinalysis for diabetes  01/29/2024   Medicare Annual Wellness Visit  05/20/2024   Pneumonia Vaccine  Completed   Hepatitis C Screening  Completed   HPV Vaccine  Aged Out  *Topic was postponed. The date shown is not the original due date.    Advanced directives: (Copy Requested) Please bring a copy of your health care power of attorney and living will to the office to be added to your chart at your convenience. You can mail to [facility] [address] [place], [place] [address] or email to [email or web address]  Next Medicare Annual Wellness Visit scheduled for next year: Yes

## 2023-05-21 NOTE — Progress Notes (Addendum)
 Subjective:   Tyrone Jefferson is a 68 y.o. who presents for a Medicare Wellness preventive visit.  Visit Complete: Virtual I connected with  Tyrone Jefferson on 06/11/23 by a audio enabled telemedicine application and verified that I am speaking with the correct person using two identifiers.  Patient Location: Home  Provider Location: Home Office  I discussed the limitations of evaluation and management by telemedicine. The patient expressed understanding and agreed to proceed.  Vital Signs: Because this visit was a virtual/telehealth visit, some criteria may be missing or patient reported. Any vitals not documented were not able to be obtained and vitals that have been documented are patient reported.    Persons Participating in Visit: Patient.  AWV Questionnaire: Yes: Patient Medicare AWV questionnaire was completed by the patient on 05/17/23; I have confirmed that all information answered by patient is correct and no changes since this date.  Cardiac Risk Factors include: advanced age (>74men, >94 women);male gender;diabetes mellitus;hypertension     Objective:    Today's Vitals   05/21/23 1004  Weight: 199 lb (90.3 kg)  Height: 5\' 9"  (1.753 m)   Body mass index is 29.39 kg/m.     05/21/2023   10:13 AM 05/10/2022    1:07 PM  Advanced Directives  Does Patient Have a Medical Advance Directive? Yes Yes  Type of Estate agent of [place];Living will Healthcare Power of [place];Living will  Copy of Healthcare Power of Attorney in Chart? No - copy requested No - copy requested    Current Medications (verified) Outpatient Encounter Medications as of 05/21/2023  Medication Sig   amLODipine  (NORVASC ) 10 MG tablet TAKE 1 TABLET BY MOUTH DAILY   Blood Glucose Monitoring Suppl DEVI 1 each by Does not apply route in the morning, at noon, and at bedtime. May substitute to any manufacturer covered by patient's insurance.   hydrochlorothiazide  (HYDRODIURIL )  25 MG tablet TAKE 1 TABLET BY MOUTH DAILY   KLOR-CON  M20 20 MEQ tablet TAKE 1 TABLET BY MOUTH 2 TIMES A DAY   lisinopril  (ZESTRIL ) 40 MG tablet TAKE 1 TABLET BY MOUTH DAILY   metFORMIN  (GLUCOPHAGE -XR) 750 MG 24 hr tablet TAKE 1 TABLET BY MOUTH DAILY WITH BREAKFAST   metoprolol  succinate (TOPROL -XL) 50 MG 24 hr tablet TAKE 1 TABLET BY MOUTH DAILY IMMEDIATELY FOLLOWING A MEAL   metroNIDAZOLE  (METROGEL ) 0.75 % gel Apply 1 Application topically 2 (two) times daily.   Multiple Vitamin (MULTIVITAMIN) tablet Take 1 tablet by mouth daily.   rosuvastatin  (CRESTOR ) 10 MG tablet TAKE 1 TABLET BY MOUTH DAILY   No facility-administered encounter medications on file as of 05/21/2023.    Allergies (verified) Patient has no known allergies.   History: Past Medical History:  Diagnosis Date   Chest pain    Dyspnea    Hyperlipidemia    Hypertension    Metabolic syndrome    OSA (obstructive sleep apnea)    Sleep apnea    Past Surgical History:  Procedure Laterality Date   BACK SURGERY  1992   SPINE SURGERY  1992   L4-5 discectomy   Family History  Problem Relation Age of Onset   Brain cancer Mother 28   Heart attack Mother 46   Hypertension Mother    Diabetes Mother    Heart disease Father 16   Colon cancer Father    Stroke Brother    Rectal cancer Neg Hx    Stomach cancer Neg Hx    Social History   Socioeconomic  History   Marital status: Married    Spouse name: Tyrone Jefferson   Number of children: N   Years of education: Not on file   Highest education level: Bachelor's degree (e.g., BA, AB, BS)  Occupational History   Occupation: route sales  Tobacco Use   Smoking status: Never   Smokeless tobacco: Never  Vaping Use   Vaping status: Never Used  Substance and Sexual Activity   Alcohol use: No   Drug use: No   Sexual activity: Not on file  Other Topics Concern   Not on file  Social History Narrative   Not on file   Social Drivers of Health   Financial Resource Strain: Low  Risk  (05/21/2023)   Overall Financial Resource Strain (CARDIA)    Difficulty of Paying Living Expenses: Not hard at all  Food Insecurity: No Food Insecurity (05/21/2023)   Hunger Vital Sign    Worried About Running Out of Food in the Last Year: Never true    Ran Out of Food in the Last Year: Never true  Transportation Needs: No Transportation Needs (05/21/2023)   PRAPARE - Administrator, Civil Service (Medical): No    Lack of Transportation (Non-Medical): No  Physical Activity: Insufficiently Active (05/21/2023)   Exercise Vital Sign    Days of Exercise per Week: 1 day    Minutes of Exercise per Session: 10 min  Stress: No Stress Concern Present (05/21/2023)   [employer] of Occupational Health - Occupational Stress Questionnaire    Feeling of Stress : Not at all  Social Connections: Moderately Integrated (05/21/2023)   Social Connection and Isolation Panel [NHANES]    Frequency of Communication with Friends and Family: Three times a week    Frequency of Social Gatherings with Friends and Family: Once a week    Attends Religious Services: 1 to 4 times per year    Active Member of [employer] or Organizations: No    Attends Engineer, structural: Not on file    Marital Status: Married    Tobacco Counseling Counseling given: Not Answered    Clinical Intake:  Pre-visit preparation completed: Yes  Pain : No/denies pain     BMI - recorded: 29.39 Nutritional Status: BMI 25 -29 Overweight Nutritional Risks: None Diabetes: Yes CBG done?: No Did pt. bring in CBG monitor from home?: No  Lab Results  Component Value Date   HGBA1C 7.2 (H) 06/01/2023   HGBA1C 6.7 (A) 01/29/2023   HGBA1C 6.8 (A) 10/30/2022     How often do you need to have someone help you when you read instructions, pamphlets, or other written materials from your doctor or pharmacy?: 1 - Never  Interpreter Needed?: No  Information entered by :: Farris Hong LPN   Activities of Daily  Living     05/21/2023   10:10 AM 05/17/2023    9:05 AM  In your present state of health, do you have any difficulty performing the following activities:  Hearing? 0 0  Vision? 0 0  Difficulty concentrating or making decisions? 0 0  Walking or climbing stairs? 0 0  Dressing or bathing? 0 0  Doing errands, shopping? 0 0  Preparing Food and eating ? N N  Using the Toilet? N N  In the past six months, have you accidently leaked urine? N Y  Do you have problems with loss of bowel control? N N  Managing your Medications? N N  Managing your Finances? N N  Housekeeping or  managing your Housekeeping? N N    Patient Care Team: Marquetta Sit, MD as PCP - General (Family Medicine)  Indicate any recent Medical Services you may have received from other than [facility] providers in the past year (date may be approximate).     Assessment:   This is a routine wellness examination for Tyrone Jefferson.  Hearing/Vision screen Hearing Screening - Comments:: Denies hearing difficulties   Vision Screening - Comments:: Wears rx glasses - up to date with [facility] exams with  [facility]   Goals Addressed               This Visit's Progress     Maintain A1C and B/P (pt-stated)        Walk More!       Depression Screen     05/21/2023   10:15 AM 05/10/2022    1:05 PM 01/24/2022    8:35 AM 08/25/2021    9:38 AM 01/11/2021   11:45 AM 01/13/2020   10:39 AM 11/11/2018    2:18 PM  PHQ 2/9 Scores  PHQ - 2 Score 0 0 0 0 0 0 0  PHQ- 9 Score    0  0 0    Fall Risk     05/28/2023    6:53 PM 05/21/2023   10:11 AM 05/17/2023    9:05 AM 05/10/2022    1:06 PM 05/09/2022    8:25 PM  Fall Risk   Falls in the past year? 0 0 0 0 0  Number falls in past yr:  0  0   Injury with Fall?  0  0   Risk for fall due to :  No Fall Risks  No Fall Risks   Follow up  Falls prevention discussed;Falls evaluation completed  Falls prevention discussed     MEDICARE RISK AT HOME:  Medicare Risk at Home Any stairs in or  around the home?: No If so, are there any without handrails?: No Home free of loose throw rugs in walkways, pet beds, electrical cords, etc?: Yes Adequate lighting in your home to reduce risk of falls?: Yes Life alert?: No Use of a cane, walker or w/c?: No Grab bars in the bathroom?: No Shower chair or bench in shower?: No Elevated toilet seat or a handicapped toilet?: No  TIMED UP AND GO:  Was the test performed?  No  Cognitive Function: 6CIT completed        05/21/2023   10:13 AM 05/10/2022    1:07 PM  6CIT Screen  What Year? 0 points 0 points  What month? 0 points 0 points  What time? 0 points 0 points  Count back from 20 0 points 0 points  Months in reverse 0 points 0 points  Repeat phrase 0 points 0 points  Total Score 0 points 0 points    Immunizations Immunization History  Administered Date(s) Administered   Influenza,inj,Quad PF,6+ Mos 11/11/2018   PFIZER(Purple Top)SARS-COV-2 Vaccination 05/24/2019, 06/14/2019, 01/26/2020   PNEUMOCOCCAL CONJUGATE-20 01/24/2022   Tdap 10/12/2011    Screening Tests Health Maintenance  Topic Date Due   FOOT EXAM  Never done   Zoster Vaccines- Shingrix (1 of 2) Never done   DTaP/Tdap/Td (2 - Td or Tdap) 10/11/2021   COVID-19 Vaccine (4 - 2024-25 season) 10/22/2022   OPHTHALMOLOGY EXAM  07/07/2023   INFLUENZA VACCINE  09/21/2023   HEMOGLOBIN A1C  12/01/2023   Colonoscopy  01/02/2024   Medicare Annual Wellness (AWV)  05/20/2024   Diabetic  kidney evaluation - eGFR measurement  05/31/2024   Diabetic kidney evaluation - Urine ACR  05/31/2024   Pneumonia Vaccine 21+ Years old  Completed   Hepatitis C Screening  Completed   HPV VACCINES  Aged Out   Meningococcal B Vaccine  Aged Out    Health Maintenance  Health Maintenance Due  Topic Date Due   FOOT EXAM  Never done   Zoster Vaccines- Shingrix (1 of 2) Never done   DTaP/Tdap/Td (2 - Td or Tdap) 10/11/2021   COVID-19 Vaccine (4 - 2024-25 season) 10/22/2022   Health  Maintenance Items Addressed:    Additional Screening:  Vision Screening: Recommended annual ophthalmology exams for early detection of glaucoma and other disorders of the eye.  Dental Screening: Recommended annual dental exams for proper oral hygiene  Community Resource Referral / Chronic Care Management: CRR required this visit?  No   CCM required this visit?  No     Plan:     I have personally reviewed and noted the following in the patient's chart:   Medical and social history Use of alcohol, tobacco or illicit drugs  Current medications and supplements including opioid prescriptions. Patient is not currently taking opioid prescriptions. Functional ability and status Nutritional status Physical activity Advanced directives List of other physicians Hospitalizations, surgeries, and ER visits in previous 12 months Vitals Screenings to include cognitive, depression, and falls Referrals and appointments  In addition, I have reviewed and discussed with patient certain preventive protocols, quality metrics, and best practice recommendations. A written personalized care plan for preventive services as well as general preventive health recommendations were provided to patient.     Dewayne Ford, LPN   [id number]/[id number]   After Visit Summary: (MyChart) Due to this being a telephonic visit, the after visit summary with patients personalized plan was offered to patient via MyChart   Notes: Nothing significant to report at this time.

## 2023-06-01 ENCOUNTER — Encounter: Payer: Self-pay | Admitting: Family Medicine

## 2023-06-01 ENCOUNTER — Ambulatory Visit: Payer: Medicare Other | Admitting: Family Medicine

## 2023-06-01 VITALS — BP 140/70 | HR 74 | Temp 98.3°F | Wt 203.5 lb

## 2023-06-01 DIAGNOSIS — I10 Essential (primary) hypertension: Secondary | ICD-10-CM

## 2023-06-01 DIAGNOSIS — R972 Elevated prostate specific antigen [PSA]: Secondary | ICD-10-CM | POA: Diagnosis not present

## 2023-06-01 DIAGNOSIS — Z7984 Long term (current) use of oral hypoglycemic drugs: Secondary | ICD-10-CM

## 2023-06-01 DIAGNOSIS — E1165 Type 2 diabetes mellitus with hyperglycemia: Secondary | ICD-10-CM

## 2023-06-01 DIAGNOSIS — E876 Hypokalemia: Secondary | ICD-10-CM

## 2023-06-01 LAB — BASIC METABOLIC PANEL WITH GFR
BUN: 17 mg/dL (ref 6–23)
CO2: 35 meq/L — ABNORMAL HIGH (ref 19–32)
Calcium: 9.7 mg/dL (ref 8.4–10.5)
Chloride: 96 meq/L (ref 96–112)
Creatinine, Ser: 1.02 mg/dL (ref 0.40–1.50)
GFR: 76.24 mL/min (ref >60.00)
Glucose, Bld: 153 mg/dL — ABNORMAL HIGH (ref 70–99)
Potassium: 3.7 meq/L (ref 3.5–5.1)
Sodium: 141 meq/L (ref 135–145)

## 2023-06-01 LAB — PSA: PSA: 3.05 ng/mL (ref 0.10–4.00)

## 2023-06-01 LAB — HEMOGLOBIN A1C: Hgb A1c MFr Bld: 7.2 % — ABNORMAL HIGH (ref 4.6–6.5)

## 2023-06-01 LAB — MICROALBUMIN / CREATININE URINE RATIO
Creatinine,U: 282.6 mg/dL
Microalb Creat Ratio: 54.2 mg/g — ABNORMAL HIGH (ref 0.0–30.0)
Microalb, Ur: 15.3 mg/dL — ABNORMAL HIGH (ref 0.0–1.9)

## 2023-06-01 NOTE — Progress Notes (Signed)
 Established Patient Office Visit  Subjective   Patient ID: Tyrone Jefferson, male    DOB: 1955/12/29  Age: 68 y.o. MRN: [id number]  Chief Complaint  Patient presents with   Medical Management of Chronic Issues    HPI   Tyrone Jefferson is seen for medical follow-up.  He has history of hypertension, obstructive sleep apnea, type 2 diabetes, dyslipidemia.  Back in December he had several abnormal labs.  Potassium slightly low 3.3.  PSA was normal range but had increased some from a year ago.  He is also needing follow-up A1c today.  Medications reviewed and include rosuvastatin, metoprolol, metformin, lisinopril, potassium, HCTZ, and amlodipine.  Compliant with medications.  He is taking his potassium supplement regularly.  Denies any recent chest pains.  Appetite and weight stable.  Weight is up just a few pounds from last visit in December.  Past Medical History:  Diagnosis Date   Chest pain    Dyspnea    Hyperlipidemia    Hypertension    Metabolic syndrome    OSA (obstructive sleep apnea)    Sleep apnea    Past Surgical History:  Procedure Laterality Date   BACK SURGERY  1992   SPINE SURGERY  1992   L4-5 discectomy    reports that he has never smoked. He has never used smokeless tobacco. He reports that he does not drink alcohol and does not use drugs. family history includes Brain cancer (age of onset: 58) in his mother; Colon cancer in his father; Diabetes in his mother; Heart attack (age of onset: 5) in his mother; Heart disease (age of onset: 88) in his father; Hypertension in his mother; Stroke in his brother. No Known Allergies  Review of Systems  Constitutional:  Negative for malaise/fatigue.  Eyes:  Negative for blurred vision.  Respiratory:  Negative for shortness of breath.   Cardiovascular:  Negative for chest pain.  Gastrointestinal:  Negative for abdominal pain.  Neurological:  Negative for dizziness, weakness and headaches.      Objective:     BP (!) 140/70  (BP Location: Left Arm, Patient Position: Sitting, Cuff Size: Large)   Pulse 74   Temp 98.3 F (36.8 C) (Oral)   Wt 203 lb 8 oz (92.3 kg)   SpO2 95%   BMI 30.05 kg/m    Physical Exam Vitals reviewed.  Constitutional:      General: He is not in acute distress.    Appearance: He is well-developed. He is not ill-appearing.  Eyes:     Pupils: Pupils are equal, round, and reactive to light.  Neck:     Thyroid: No thyromegaly.  Cardiovascular:     Rate and Rhythm: Normal rate and regular rhythm.  Pulmonary:     Effort: Pulmonary effort is normal. No respiratory distress.     Breath sounds: Normal breath sounds. No wheezing or rales.  Musculoskeletal:     Cervical back: Neck supple.  Neurological:     Mental Status: He is alert and oriented to person, place, and time.      Results for orders placed or performed in visit on 06/01/23  Hemoglobin A1c  Result Value Ref Range   Hgb A1c MFr Bld 7.2 (H) 4.6 - 6.5 %  Microalbumin / creatinine urine ratio  Result Value Ref Range   Microalb, Ur 15.3 (H) 0.0 - 1.9 mg/dL   Creatinine,U 629.5 mg/dL   Microalb Creat Ratio 54.2 (H) 0.0 - 30.0 mg/g  PSA  Result Value Ref Range  PSA 3.05 0.10 - 4.00 ng/mL  Basic metabolic panel with GFR  Result Value Ref Range   Sodium 141 135 - 145 mEq/L   Potassium 3.7 3.5 - 5.1 mEq/L   Chloride 96 96 - 112 mEq/L   CO2 35 (H) 19 - 32 mEq/L   Glucose, Bld 153 (H) 70 - 99 mg/dL   BUN 17 6 - 23 mg/dL   Creatinine, Ser 8.41 0.40 - 1.50 mg/dL   GFR 32.44 >01.02 mL/min   Calcium 9.7 8.4 - 10.5 mg/dL      The ASCVD Risk score (Arnett DK, et al., 2019) failed to calculate for the following reasons:   The valid total cholesterol range is 130 to 320 mg/dL    Assessment & Plan:   Problem List Items Addressed This Visit       Unprioritized   Type 2 diabetes mellitus with hyperglycemia (HCC) - Primary   Relevant Orders   Microalbumin / creatinine urine ratio (Completed)   Hemoglobin A1c  (Completed)   Hypertension   Relevant Orders   Basic metabolic panel with GFR (Completed)   Other Visit Diagnoses       Hypokalemia         Increased prostate specific antigen (PSA) velocity       Relevant Orders   PSA (Completed)     Elevated PSA         History of chronic medical problems as above.  Last potassium was slightly low and rechecking that today along with repeat A1c and PSA secondary to increase from last year.  Continue lower glycemic low saturated fat diet.  Pressure was up a bit today.  Repeat was actually 152/80.  This has been atypical for him recently.  Would recommend more close home monitoring of the next few weeks and scheduled follow-up no later than a couple months to reassess  Return in about 3 months (around 08/31/2023).    Evelena Peat, MD

## 2023-06-01 NOTE — Patient Instructions (Signed)
 Keep sodium down and monitor blood pressure more frequently.

## 2023-06-04 ENCOUNTER — Other Ambulatory Visit: Payer: Self-pay | Admitting: *Deleted

## 2023-06-04 MED ORDER — EMPAGLIFLOZIN 10 MG PO TABS: 10.0000 mg | ORAL_TABLET | Freq: Every day | ORAL | 5 refills | Status: DC

## 2023-06-15 ENCOUNTER — Other Ambulatory Visit: Payer: Self-pay | Admitting: Family Medicine

## 2023-06-15 DIAGNOSIS — I10 Essential (primary) hypertension: Secondary | ICD-10-CM

## 2023-06-18 ENCOUNTER — Ambulatory Visit: Payer: Self-pay

## 2023-06-18 NOTE — Telephone Encounter (Signed)
 1st attempt to call the patient, no answer, left voicemail for patient to call back. Will route to call backs for additional attempts.    Copied from CRM [id number]. Topic: Clinical - Red Word Triage >> Jun 18, 2023  1:08 PM Lizabeth Riggs wrote: Red Word that prompted transfer to Nurse Triage:  He has started empagliflozin  (JARDIANCE ) 10 MG TABS tablet on 06/07/23. For the past 10 days, he is swollen on his penis. The call dropped and I tried to call him back and he could not hear me. Please call [facility]

## 2023-06-18 NOTE — Telephone Encounter (Signed)
 Chief Complaint: foreskin problem Symptoms: pain, swelling, redness Frequency: 10 days Pertinent Negatives: Patient denies fever, abd pain, scrotum pain or swelling, urinary symptoms Disposition: [] ED /[] Urgent Care (no appt availability in office) / [x] Appointment(In office/virtual)/ []  [facility]/ [] Home Care/ [] Refused Recommended Disposition /[] [facility]/ []  Follow-up with PCP Additional Notes: Pt reports redness and swelling to his foreskin for 10 days. Pt states he believes this is a side effect of Jardiance . Pt also endorses a stinging sensation when he pulls the foreskin back, he rates that 1-2/10 pain "feels like a hangnail." Pt denies pain or swelling to his scrotum. Pt denies difficulty urinating or any urinary symptoms. Denies abd pain, fever, chills, discharge, bleeding. RN scheduled pt for tomorrow 4/29 at 1045. RN advised pt if he worsens he needs to call us  a call.     Copied from CRM [id number]. Topic: Clinical - Red Word Triage >> Jun 18, 2023  1:13 PM Georgeann Kindred wrote: [facility] that prompted transfer to Nurse Triage: Swelling on an uncircumcised penis. It is red and swollen. This is a side effect from taking the Jardiance  medication Reason for Disposition  [1] Not circumcised AND [2] swollen foreskin  Answer Assessment - Initial Assessment Questions 1. SYMPTOM: "What's the main symptom you're concerned about?" (e.g., discharge from penis, rash, pain, itching, swelling)     Redness, stinging with pulling the foreskin back, swelling of the foreskin 2. LOCATION: "Where is the pain located?"     Foreskin  3. ONSET: "When did pain start?"     10 days 4. PAIN: "Is there any pain?" If Yes, ask: "How bad is it?"  (Scale 1-10; or mild, moderate, severe)     "I don't know", a 1-2/10, "it's like a hang nail" 5. URINE: "Any difficulty passing urine?" If Yes, ask: "When was the last time?"     No 6. CAUSE: "What do you think is causing the symptoms?"      Jardiance   7. OTHER SYMPTOMS: "Do you have any other symptoms?" (e.g., fever, abdomen pain, blood in urine)     Last time he had something similar was last year before taking Jardiance , pt states he was told he had jock itch so pt has put jock itch cream on it which seems to help but it does not go away. Denies fever or chills. Denies difficulty passing urine. Denies abdominal pain. Denies hematuria.  Protocols used: Penis and Scrotum Symptoms-A-AH

## 2023-06-18 NOTE — Telephone Encounter (Signed)
See other triage encounter.

## 2023-06-19 ENCOUNTER — Ambulatory Visit (INDEPENDENT_AMBULATORY_CARE_PROVIDER_SITE_OTHER): Admitting: Family Medicine

## 2023-06-19 ENCOUNTER — Encounter: Payer: Self-pay | Admitting: Family Medicine

## 2023-06-19 VITALS — BP 140/78 | HR 68 | Temp 98.1°F | Ht 69.0 in | Wt 200.0 lb

## 2023-06-19 DIAGNOSIS — N481 Balanitis: Secondary | ICD-10-CM

## 2023-06-19 DIAGNOSIS — R300 Dysuria: Secondary | ICD-10-CM | POA: Diagnosis not present

## 2023-06-19 LAB — POC URINALSYSI DIPSTICK (AUTOMATED)
Bilirubin, UA: NEGATIVE
Blood, UA: POSITIVE
Glucose, UA: POSITIVE — AB
Ketones, UA: NEGATIVE
Leukocytes, UA: NEGATIVE
Nitrite, UA: NEGATIVE
Protein, UA: POSITIVE — AB
Spec Grav, UA: 1.015 (ref 1.010–1.025)
Urobilinogen, UA: 0.2 U/dL
pH, UA: 6 (ref 5.0–8.0)

## 2023-06-19 MED ORDER — FLUCONAZOLE 100 MG PO TABS: 100.0000 mg | ORAL_TABLET | Freq: Every day | ORAL | 0 refills | Status: DC

## 2023-06-19 NOTE — Progress Notes (Signed)
 Established Patient Office Visit  Subjective   Patient ID: Tyrone Jefferson, male    DOB: 14-Jul-1955  Age: 68 y.o. MRN: [id number]  Chief Complaint  Patient presents with   Penis Pain    HPI   [name] is here with some foreskin irritation.  He has type 2 diabetes and we just recently started Jardiance  back on April 17.  He is noncircumcised.  He notes libido redness around the distal penis involving the glans and distal shaft of the penis.  No white discharge.  He tried some over-the-counter Lamisil without much improvement.  Only, occasional mild burning with urination.  No fever.  Past Medical History:  Diagnosis Date   Chest pain    Dyspnea    Hyperlipidemia    Hypertension    Metabolic syndrome    OSA (obstructive sleep apnea)    Sleep apnea    Past Surgical History:  Procedure Laterality Date   BACK SURGERY  1992   SPINE SURGERY  1992   L4-5 discectomy    reports that he has never smoked. He has never used smokeless tobacco. He reports that he does not drink alcohol and does not use drugs. family history includes Brain cancer (age of onset: 18) in his mother; Colon cancer in his father; Diabetes in his mother; Heart attack (age of onset: 100) in his mother; Heart disease (age of onset: 34) in his father; Hypertension in his mother; Stroke in his brother. No Known Allergies  Review of Systems  Constitutional:  Negative for chills and fever.  Genitourinary:  Positive for dysuria.  Skin:  Positive for rash.      Objective:     BP (!) 140/78 (BP Location: Left Arm, Patient Position: Sitting, Cuff Size: Normal)   Pulse 68   Temp 98.1 F (36.7 C) (Oral)   Ht 5\' 9"  (1.753 m)   Wt 200 lb (90.7 kg)   SpO2 97%   BMI 29.53 kg/m  BP Readings from Last 3 Encounters:  06/19/23 (!) 140/78  06/01/23 (!) 140/70  01/29/23 116/60   Wt Readings from Last 3 Encounters:  06/19/23 200 lb (90.7 kg)  06/01/23 203 lb 8 oz (92.3 kg)  05/21/23 199 lb (90.3 kg)       Physical Exam Vitals reviewed.  Constitutional:      General: He is not in acute distress.    Appearance: He is not ill-appearing.  Cardiovascular:     Rate and Rhythm: Normal rate and regular rhythm.  Pulmonary:     Effort: Pulmonary effort is normal.     Breath sounds: Normal breath sounds.  Skin:    Comments: Foreskin retracted.  No whitish discharge.  Does have some erythema to the glans and distal shaft of the penis otherwise clear.  No ulceration.  Neurological:     Mental Status: He is alert.      Results for orders placed or performed in visit on 06/19/23  POCT Urinalysis Dipstick (Automated)  Result Value Ref Range   Color, UA yellow    Clarity, UA clear    Glucose, UA Positive (A) Negative   Bilirubin, UA neg    Ketones, UA neg    Spec Grav, UA 1.015 1.010 - 1.025   Blood, UA pos    pH, UA 6.0 5.0 - 8.0   Protein, UA Positive (A) Negative   Urobilinogen, UA 0.2 0.2 or 1.0 E.U./dL   Nitrite, UA neg    Leukocytes, UA Negative Negative  The ASCVD Risk score (Arnett DK, et al., 2019) failed to calculate for the following reasons:   The valid total cholesterol range is 130 to 320 mg/dL    Assessment & Plan:   Presents with some recent irritation glans and distal penis with some redness after starting Jardiance .  Dipstick shows no leukocytes or nitrites.  Has expected glucosuria since he is on Jardiance .  Suspect probably does have some fungal infection.  No exudate.  We recommend the following  -Stop Jardiance  for 2 weeks - Start fluconazole 100 mg once daily for 5 days - If rash fully cleared in 2 weeks consider retrial of Jardiance .  If redness recurs again we will stop SGLT2 inhibitor and look at other options for his diabetes.   No follow-ups on file.    Glean Lamy, MD

## 2023-06-19 NOTE — Patient Instructions (Signed)
 Hold the Jardiance  for two weeks  Let me know if skin irritation not clearing with the Fluconazole.    Consider retrial of Jardiance  in two weeks and let me know if rash recurs.

## 2023-06-29 ENCOUNTER — Encounter: Payer: Self-pay | Admitting: Family Medicine

## 2023-06-29 ENCOUNTER — Ambulatory Visit (INDEPENDENT_AMBULATORY_CARE_PROVIDER_SITE_OTHER): Admitting: Family Medicine

## 2023-06-29 VITALS — BP 134/80 | HR 66 | Temp 98.1°F | Wt 200.4 lb

## 2023-06-29 DIAGNOSIS — R21 Rash and other nonspecific skin eruption: Secondary | ICD-10-CM

## 2023-06-29 DIAGNOSIS — Z7985 Long-term (current) use of injectable non-insulin antidiabetic drugs: Secondary | ICD-10-CM | POA: Diagnosis not present

## 2023-06-29 DIAGNOSIS — E1165 Type 2 diabetes mellitus with hyperglycemia: Secondary | ICD-10-CM | POA: Diagnosis not present

## 2023-06-29 DIAGNOSIS — N481 Balanitis: Secondary | ICD-10-CM

## 2023-06-29 NOTE — Progress Notes (Signed)
 Established Patient Office Visit  Subjective   Patient ID: Tyrone Jefferson, male    DOB: Nov 15, 1955  Age: 68 y.o. MRN: [id number]  Chief Complaint  Patient presents with   Skin Problem    HPI   Nathanyl seen for the following items  Skin rash right upper shoulder which was just noted days ago.  Nonpainful.  May have had some mild pruritus initially but none since then.  His wife just noticed this recently.  He felt this may have occurred after working outdoors.  Does not recall any specific injury.  No fevers or chills.  No other rash noted.  Has been applying some Neosporin topically without any change.  Recently had infection on his foreskin.  He is on Jardiance  for type 2 diabetes and history of an circumcision.  We treated with fluconazole  and those symptoms have fully cleared.  Our plan was to leave off Jardiance  for about 2 weeks then retry.  He is aware if he has recurrence again we will need to get rid of Jardiance  or any other SGLT2 medications and possibly look at other alternatives such as GLP-1.  He does remain on metformin .  Last A1c 7.2%.  Past Medical History:  Diagnosis Date   Chest pain    Dyspnea    Hyperlipidemia    Hypertension    Metabolic syndrome    OSA (obstructive sleep apnea)    Sleep apnea    Past Surgical History:  Procedure Laterality Date   BACK SURGERY  1992   SPINE SURGERY  1992   L4-5 discectomy    reports that he has never smoked. He has never used smokeless tobacco. He reports that he does not drink alcohol and does not use drugs. family history includes Brain cancer (age of onset: 13) in his mother; Colon cancer in his father; Diabetes in his mother; Heart attack (age of onset: 67) in his mother; Heart disease (age of onset: 36) in his father; Hypertension in his mother; Stroke in his brother. No Known Allergies  Review of Systems  Constitutional:  Negative for chills and fever.  Genitourinary:  Negative for dysuria.  Skin:  Positive for  rash.      Objective:     BP 134/80 (BP Location: Left Arm, Patient Position: Sitting, Cuff Size: Normal)   Pulse 66   Temp 98.1 F (36.7 C) (Oral)   Wt 200 lb 6.4 oz (90.9 kg)   SpO2 95%   BMI 29.59 kg/m  BP Readings from Last 3 Encounters:  06/29/23 134/80  06/19/23 (!) 140/78  06/01/23 (!) 140/70   Wt Readings from Last 3 Encounters:  06/29/23 200 lb 6.4 oz (90.9 kg)  06/19/23 200 lb (90.7 kg)  06/01/23 203 lb 8 oz (92.3 kg)      Physical Exam Vitals reviewed.  Constitutional:      General: He is not in acute distress.    Appearance: He is not ill-appearing.  Cardiovascular:     Rate and Rhythm: Normal rate and regular rhythm.  Pulmonary:     Effort: Pulmonary effort is normal.     Breath sounds: Normal breath sounds.  Skin:    Comments: He has a couple of small erythematous areas right posterior shoulder each about a centimeter diameter.  Near the center very small flaky area.  No pustule.  No vesicles.  Nontender.  No warmth.  Neurological:     Mental Status: He is alert.      No results found  for any visits on 06/29/23.    The ASCVD Risk score (Arnett DK, et al., 2019) failed to calculate for the following reasons:   The valid total cholesterol range is 130 to 320 mg/dL    Assessment & Plan:   #1 probable bug/insect bites of some sort involving right shoulder.  No evidence for abscess.  No vesicles.  Suspect local allergic reaction. -Recommend observation. -Could try over-the-counter antihistamine but currently has no symptoms of pruritus. -Watch for any progressive erythema or other concerns  #2 recent yeast balanitis.  Patient taken off Jardiance  temporarily.  Symptoms fully cleared with fluconazole .  He plans to leave off the Jardiance  until May 14 and then retry once more.  If he has any recurrence after that would need to stop SGLT2 medication and consider GLP-1 medication.  Glean Lamy, MD

## 2023-06-29 NOTE — Patient Instructions (Signed)
 Suspect some type of bug bite- may be red for several days/weeks, but should not be spreading.   Follow up for any progressive redness or other concerns.

## 2023-07-17 DIAGNOSIS — G4733 Obstructive sleep apnea (adult) (pediatric): Secondary | ICD-10-CM | POA: Diagnosis not present

## 2023-07-30 ENCOUNTER — Ambulatory Visit (INDEPENDENT_AMBULATORY_CARE_PROVIDER_SITE_OTHER): Payer: Medicare Other | Admitting: Family Medicine

## 2023-07-30 ENCOUNTER — Encounter: Payer: Self-pay | Admitting: Family Medicine

## 2023-07-30 VITALS — BP 142/70 | HR 69 | Temp 98.1°F | Wt 196.6 lb

## 2023-07-30 DIAGNOSIS — I10 Essential (primary) hypertension: Secondary | ICD-10-CM | POA: Diagnosis not present

## 2023-07-30 DIAGNOSIS — R809 Proteinuria, unspecified: Secondary | ICD-10-CM

## 2023-07-30 DIAGNOSIS — E1165 Type 2 diabetes mellitus with hyperglycemia: Secondary | ICD-10-CM

## 2023-07-30 DIAGNOSIS — Z7984 Long term (current) use of oral hypoglycemic drugs: Secondary | ICD-10-CM | POA: Diagnosis not present

## 2023-07-30 NOTE — Progress Notes (Signed)
 Established Patient Office Visit  Subjective   Patient ID: Tyrone Jefferson, male    DOB: 11/08/1955  Age: 68 y.o. MRN: [id number]  No chief complaint on file.   HPI   Tyrone Jefferson is seen for medical follow-up.  He has type 2 diabetes and back in May had increased urine microalbumin level.  He was already on lisinopril  40 mg daily and we added Jardiance  10 mg daily.  He did have possible yeast infection once which improved with fluconazole .  None since then.  Recent A1c 7.2%.  He remains on extended release metformin  and Jardiance .  Blood pressures have been challenging to control on multidrug regimen including HCTZ, lisinopril , Toprol -XL, and amlodipine .  Not consistently exercising.  He is retired and stays very engaged with house activities but no formal exercise such as walking.  Home blood pressures have been fairly consistently 130s systolic and 70-80 diastolic  Past Medical History:  Diagnosis Date   Chest pain    Dyspnea    Hyperlipidemia    Hypertension    Metabolic syndrome    OSA (obstructive sleep apnea)    Sleep apnea    Past Surgical History:  Procedure Laterality Date   BACK SURGERY  1992   SPINE SURGERY  1992   L4-5 discectomy    reports that he has never smoked. He has never used smokeless tobacco. He reports that he does not drink alcohol and does not use drugs. family history includes Brain cancer (age of onset: 10) in his mother; Colon cancer in his father; Diabetes in his mother; Heart attack (age of onset: 69) in his mother; Heart disease (age of onset: 67) in his father; Hypertension in his mother; Stroke in his brother. No Known Allergies  Review of Systems  Constitutional:  Negative for malaise/fatigue.  Eyes:  Negative for blurred vision.  Respiratory:  Negative for shortness of breath.   Cardiovascular:  Negative for chest pain.  Neurological:  Negative for dizziness, weakness and headaches.      Objective:     BP (!) 142/70 (BP  Location: Left Arm, Patient Position: Sitting, Cuff Size: Normal)   Pulse 69   Temp 98.1 F (36.7 C) (Oral)   Wt 196 lb 9.6 oz (89.2 kg)   SpO2 96%   BMI 29.03 kg/m  BP Readings from Last 3 Encounters:  07/30/23 (!) 142/70  06/29/23 134/80  06/19/23 (!) 140/78   Wt Readings from Last 3 Encounters:  07/30/23 196 lb 9.6 oz (89.2 kg)  06/29/23 200 lb 6.4 oz (90.9 kg)  06/19/23 200 lb (90.7 kg)      Physical Exam Vitals reviewed.  Constitutional:      General: He is not in acute distress.    Appearance: He is well-developed. He is not ill-appearing.  Neck:     Thyroid : No thyromegaly.  Cardiovascular:     Rate and Rhythm: Normal rate and regular rhythm.  Pulmonary:     Effort: Pulmonary effort is normal. No respiratory distress.     Breath sounds: Normal breath sounds. No wheezing or rales.  Musculoskeletal:     Cervical back: Neck supple.  Neurological:     Mental Status: He is alert and oriented to person, place, and time.      No results found for any visits on 07/30/23.  Last CBC Lab Results  Component Value Date   WBC 8.8 01/24/2022   HGB 15.9 01/24/2022   HCT 45.0 01/24/2022   MCV 83.9 01/24/2022  RDW 13.2 01/24/2022   PLT 305.0 01/24/2022   Last metabolic panel Lab Results  Component Value Date   GLUCOSE 153 (H) 06/01/2023   NA 141 06/01/2023   K 3.7 06/01/2023   CL 96 06/01/2023   CO2 35 (H) 06/01/2023   BUN 17 06/01/2023   CREATININE 1.02 06/01/2023   GFR 76.24 06/01/2023   CALCIUM  9.7 06/01/2023   PROT 7.2 01/29/2023   ALBUMIN 4.6 01/29/2023   BILITOT 0.8 01/29/2023   ALKPHOS 64 01/29/2023   AST 14 01/29/2023   ALT 11 01/29/2023   Last lipids Lab Results  Component Value Date   CHOL 107 01/29/2023   HDL 35.30 (L) 01/29/2023   LDLCALC 45 01/29/2023   LDLDIRECT 69.0 04/20/2021   TRIG 137.0 01/29/2023   CHOLHDL 3 01/29/2023   Last hemoglobin A1c Lab Results  Component Value Date   HGBA1C 7.2 (H) 06/01/2023      The ASCVD Risk  score (Arnett DK, et al., 2019) failed to calculate for the following reasons:   The valid total cholesterol range is 130 to 320 mg/dL    Assessment & Plan:   #1 type 2 diabetes.  He had recent increased urine microalbumin creatinine ratio.  Addition of Jardiance  to his metformin .  Would not recheck urine microalbumin yet but plan A1c and urine microalbumin screen at follow-up 3 months from now.  Continue lower glycemic diet needs challenge try to lose some additional weight and establish more consistent exercise  #2 hypertension.  Elevated slightly today but better readings by home readings.  Recommend establishing more consistent exercise such as walking 30 minutes briskly at 3 mph 5 days/week follow low-sodium diet.  Work on weight loss.  Continue current regimen and if not further to goal follow-up consider additional medication  Tyrone Lamy, MD

## 2023-07-30 NOTE — Patient Instructions (Signed)
 Try to establish more consistent exercise such as walking - eg 30 minutes 5 times per week  Set up 3 month follow up.

## 2023-08-24 ENCOUNTER — Other Ambulatory Visit: Payer: Self-pay | Admitting: Family Medicine

## 2023-08-24 DIAGNOSIS — E119 Type 2 diabetes mellitus without complications: Secondary | ICD-10-CM | POA: Diagnosis not present

## 2023-08-24 DIAGNOSIS — H524 Presbyopia: Secondary | ICD-10-CM | POA: Diagnosis not present

## 2023-09-24 ENCOUNTER — Other Ambulatory Visit: Payer: Self-pay | Admitting: Family Medicine

## 2023-10-19 DIAGNOSIS — G4733 Obstructive sleep apnea (adult) (pediatric): Secondary | ICD-10-CM | POA: Diagnosis not present

## 2023-10-25 ENCOUNTER — Ambulatory Visit: Payer: Medicare Other | Admitting: Internal Medicine

## 2023-10-30 ENCOUNTER — Encounter: Payer: Self-pay | Admitting: Family Medicine

## 2023-10-30 ENCOUNTER — Ambulatory Visit: Payer: Self-pay | Admitting: Family Medicine

## 2023-10-30 ENCOUNTER — Ambulatory Visit: Admitting: Family Medicine

## 2023-10-30 DIAGNOSIS — Z7984 Long term (current) use of oral hypoglycemic drugs: Secondary | ICD-10-CM

## 2023-10-30 DIAGNOSIS — R3915 Urgency of urination: Secondary | ICD-10-CM

## 2023-10-30 DIAGNOSIS — E1165 Type 2 diabetes mellitus with hyperglycemia: Secondary | ICD-10-CM

## 2023-10-30 DIAGNOSIS — R809 Proteinuria, unspecified: Secondary | ICD-10-CM | POA: Diagnosis not present

## 2023-10-30 DIAGNOSIS — I10 Essential (primary) hypertension: Secondary | ICD-10-CM | POA: Diagnosis not present

## 2023-10-30 LAB — MICROALBUMIN / CREATININE URINE RATIO
Creatinine,U: 187.6 mg/dL
Microalb Creat Ratio: 53.5 mg/g — ABNORMAL HIGH (ref 0.0–30.0)
Microalb, Ur: 10 mg/dL — ABNORMAL HIGH (ref 0.0–1.9)

## 2023-10-30 LAB — POCT GLYCOSYLATED HEMOGLOBIN (HGB A1C): Hemoglobin A1C: 6.4 % — AB (ref 4.0–5.6)

## 2023-10-30 NOTE — Patient Instructions (Signed)
 Set up physical for December.  A1C today improved to 6.4%.

## 2023-10-30 NOTE — Progress Notes (Signed)
 Established Patient Office Visit  Subjective   Patient ID: Tyrone Jefferson, male    DOB: 11-19-1955  Age: 68 y.o. MRN: [id number]  No chief complaint on file.   HPI   Tyrone Jefferson is seen today for medical follow-up.  Back in the spring his A1c was 7.2%.  We added Jardiance  which he is taking without side effect.  He had increased urine microalbumin creatinine ratio 54 at that time which was another reason we added the Jardiance  to his regimen.  His A1c today is improved to 6.4%.  He has had some urinary urgency but no burning with urination.  No obstructive urinary symptoms.  He has hypertension treated with multidrug regimen including lisinopril , amlodipine , hydrochlorothiazide , and metoprolol .  Blood pressures have been stable.  His weight is down another couple pounds from last visit.  He has hyperlipidemia treated with rosuvastatin .  Tolerating well with no myalgias.  Past Medical History:  Diagnosis Date   Chest pain    Dyspnea    Hyperlipidemia    Hypertension    Metabolic syndrome    OSA (obstructive sleep apnea)    Sleep apnea    Past Surgical History:  Procedure Laterality Date   BACK SURGERY  1992   SPINE SURGERY  1992   L4-5 discectomy    reports that he has never smoked. He has never used smokeless tobacco. He reports that he does not drink alcohol and does not use drugs. family history includes Brain cancer (age of onset: 86) in his mother; Colon cancer in his father; Diabetes in his mother; Heart attack (age of onset: 65) in his mother; Heart disease (age of onset: 11) in his father; Hypertension in his mother; Stroke in his brother. No Known Allergies  Review of Systems  Constitutional:  Negative for malaise/fatigue.  Eyes:  Negative for blurred vision.  Respiratory:  Negative for shortness of breath.   Cardiovascular:  Negative for chest pain.  Neurological:  Negative for dizziness, weakness and headaches.      Objective:     There were no vitals  taken for this visit. BP Readings from Last 3 Encounters:  07/30/23 (!) 142/70  06/29/23 134/80  06/19/23 (!) 140/78   Wt Readings from Last 3 Encounters:  07/30/23 196 lb 9.6 oz (89.2 kg)  06/29/23 200 lb 6.4 oz (90.9 kg)  06/19/23 200 lb (90.7 kg)      Physical Exam Vitals reviewed.  Constitutional:      Appearance: He is well-developed.  HENT:     Right Ear: External ear normal.     Left Ear: External ear normal.  Eyes:     Pupils: Pupils are equal, round, and reactive to light.  Neck:     Thyroid : No thyromegaly.  Cardiovascular:     Rate and Rhythm: Normal rate and regular rhythm.  Pulmonary:     Effort: Pulmonary effort is normal. No respiratory distress.     Breath sounds: Normal breath sounds. No wheezing or rales.  Musculoskeletal:     Cervical back: Neck supple.  Neurological:     Mental Status: He is alert and oriented to person, place, and time.      Results for orders placed or performed in visit on 10/30/23  POC HgB A1c  Result Value Ref Range   Hemoglobin A1C 6.4 (A) 4.0 - 5.6 %   HbA1c POC (<> result, manual entry)     HbA1c, POC (prediabetic range)     HbA1c, POC (controlled diabetic  range)        The ASCVD Risk score (Arnett DK, et al., 2019) failed to calculate for the following reasons:   The valid total cholesterol range is 130 to 320 mg/dL    Assessment & Plan:   #1 type 2 diabetes improved with A1c down to 6.4% today after recent addition of Jardiance .  He is tolerating medication well.  Did have increased urine microalbumin creatinine ratio and we added Jardiance  to his lisinopril .  Rechecking urine microalbumin creatinine ratio today  #2 hypertension stable on multidrug regimen as above.  Continue current medications.  Continue low-sodium diet  #3 urine urgency.  We discussed potential medication options such as Myrbetriq or Gemtesa but at this point he wishes to wait.  He does not describe any obstructive urinary symptoms.  Continue  to keep caffeine intake down.  Set up 53-month follow-up and we will plan full labs at that point including repeat lipids and chemistries   Return in about 3 months (around 01/29/2024).    Wolm Scarlet, MD

## 2023-11-21 NOTE — Progress Notes (Signed)
 4129/8gftrHPI- M never smoker followed for OSA, complicated by HTN, OSA, Obesity, Hyperlipidemia, Metabolic Syndrome, DM2,  NPSG 6/86/87- AHI 19.7/ hr, desaturation to 85%, body weight 200 lbs, CPAP to 9 HST-04/04/21- AHI 28.4/ hr, desaturation to 76%/median 88%, body weight   ========================================================================   10/24/22- 67 yoM never smoker followed for OSA, Insomnia complicated by HTN, OSA, Obesity, Hyperlipidemia, Metabolic Syndrome, DM2,  CPAP auto 5-20/ [employer]     AirSense10AutoSet        ordered 04/11/21 Download compliance- 43%, AHI 3/hr Body weight today-203 lbs                           Wife here Download reviewed.  He gets up for bathroom during the night and does not bother to put mask back on.  We discussed options.  Looks as if he dropped off when he had an unsuccessful oral appliance from Dr. Melba 6 or 8 years ago.  He is not interested in surgery.  We decided that he would continue working with CPAP as he currently has it.  I emphasized that I would be happy to look at alternatives.  He is comfortable with the mask he has after mask fitting at the sleep center.  11/22/23- 68 yoM never smoker followed for OSA, Insomnia complicated by HTN, OSA, Obesity, Hyperlipidemia, Metabolic Syndrome, DM2,  CPAP auto 5-20/ [employer]     AirSense10AutoSet        ordered 04/11/21 Download compliance- used most days, but never 4 hours, AHI 2.9/hr    Averages 2+ hrs/day Body weight today-195 lbs Discussed the use of AI scribe software for clinical note transcription with the patient, who gave verbal consent to proceed.  History of Present Illness   Tyrone Jefferson is a 68 year old male with sleep apnea who presents with difficulties using CPAP therapy. He is accompanied by his spouse.  He experiences frequent nocturnal awakenings for nocturia, which disrupt his sleep and CPAP usage. He uses CPAP for an average of two to two and a half hours per night, below the  recommended minimum of four hours. He often removes the CPAP after waking up two to four times during the night and does not consistently put it back on. The CPAP is effective while in use, as indicated by the app tracking his breathing interruptions per hour.  He wakes up multiple times at night with an urgent need to urinate, which sometimes makes it difficult for him to fall back asleep. He has not seen a urologist but has discussed these symptoms with his primary care physician. He is diabetic.     Assessment and Plan:    Obstructive sleep apnea with suboptimal CPAP adherence CPAP use is below the goal of 4 hours per night, with frequent awakenings affecting adherence. Discussed alternatives like a mouthpiece or Inspire implant surgery if adherence does not improve. - Encourage CPAP use for at least 4 hours per night. - Discuss alternative treatments if CPAP adherence remains suboptimal.  Nocturia and urinary urgency Experiences nocturia and urgency 2 to 4 times per night. Possible low-grade prostate infection or other urological issues. Current medications may contribute to symptoms. - Refer to a urologist for evaluation.     ROS-see HPI   + = positive Constitutional:    weight loss, night sweats, fevers, chills, fatigue, lassitude. HEENT:    headaches, difficulty swallowing, tooth/dental problems, sore throat,       sneezing, itching, ear ache, nasal  congestion, post nasal drip, snoring CV:    chest pain, orthopnea, PND, swelling in lower extremities, anasarca,                                   dizziness, palpitations Resp:   shortness of breath with exertion or at rest.                productive cough,   non-productive cough, coughing up of blood.              change in color of mucus.  wheezing.   Skin:    rash or lesions. GI:  No-   heartburn, indigestion, abdominal pain, nausea, vomiting, diarrhea,                 change in bowel habits, loss of appetite GU: dysuria, change in  color of urine, no urgency or frequency.   flank pain. MS:   joint pain, stiffness, decreased range of motion, back pain. Neuro-     nothing unusual Psych:  change in mood or affect.  depression or anxiety.   memory loss.  OBJ- Physical Exam General- Alert, Oriented, Affect-appropriate, Distress- none acute Skin- rash-none, lesions- none, excoriation- none Lymphadenopathy- none Head- atraumatic            Eyes- Gross vision intact, PERRLA, conjunctivae and secretions clear            Ears- Hearing, canals-normal            Nose- Clear, no-Septal dev, mucus, polyps, erosion, perforation             Throat- Mallampati IV , mucosa clear , drainage- none, tonsils- atrophic, + teeth Neck- flexible , trachea midline, no stridor , thyroid  nl, carotid no bruit Chest - symmetrical excursion , unlabored           Heart/CV- RRR , no murmur , no gallop  , no rub, nl s1 s2                           - JVD- none , edema- none, stasis changes- none, varices- none           Lung- clear to P&A, wheeze- none, cough- none , dullness-none, rub- none           Chest wall-  Abd-  Br/ Gen/ Rectal- Not done, not indicated Extrem- cyanosis- none, clubbing, none, atrophy- none, strength- nl Neuro- grossly intact to observation

## 2023-11-22 ENCOUNTER — Ambulatory Visit (INDEPENDENT_AMBULATORY_CARE_PROVIDER_SITE_OTHER): Admitting: Internal Medicine

## 2023-11-22 ENCOUNTER — Encounter: Payer: Self-pay | Admitting: Internal Medicine

## 2023-11-22 VITALS — BP 124/70 | HR 66 | Temp 98.0°F | Ht 68.0 in | Wt 195.4 lb

## 2023-11-22 DIAGNOSIS — G4733 Obstructive sleep apnea (adult) (pediatric): Secondary | ICD-10-CM | POA: Diagnosis not present

## 2023-11-22 DIAGNOSIS — R351 Nocturia: Secondary | ICD-10-CM

## 2023-11-22 DIAGNOSIS — F5101 Primary insomnia: Secondary | ICD-10-CM | POA: Diagnosis not present

## 2023-11-22 NOTE — Patient Instructions (Signed)
 Order- refer to [facility]    dx  Frequent Nocturia   Do the best you can with CPAP- our goal for you would be at least 4 hours/ night if we can.

## 2023-11-26 ENCOUNTER — Encounter: Payer: Self-pay | Admitting: Internal Medicine

## 2023-11-28 ENCOUNTER — Telehealth: Payer: Self-pay | Admitting: Internal Medicine

## 2023-11-28 NOTE — Telephone Encounter (Signed)
 Copied from CRM [id number]. Topic: Referral - Status >> Nov 28, 2023  1:34 PM Celestine FALCON wrote: Reason for CRM: Pt calling to check on status of his referral to urology. Dr. Neysa put in a request for the pt to be referred to [facility], but the pt stated he never received a call. I checked the appt desk and the referral shows closed on 11/22/2023 at 3:24 pm with no reason to why.   Please assist pt's phone number is [phone] ok to leave a vm.

## 2023-11-30 NOTE — Telephone Encounter (Signed)
 VM /LM - okay per DPR someone from Urology should be reaching out to him to schedule the appointment.   -NFN

## 2023-12-05 DIAGNOSIS — K080 Exfoliation of teeth due to systemic causes: Secondary | ICD-10-CM | POA: Diagnosis not present

## 2023-12-09 ENCOUNTER — Other Ambulatory Visit: Payer: Self-pay | Admitting: Family Medicine

## 2023-12-09 DIAGNOSIS — I10 Essential (primary) hypertension: Secondary | ICD-10-CM

## 2023-12-20 ENCOUNTER — Other Ambulatory Visit: Payer: Self-pay | Admitting: Family Medicine

## 2024-01-09 DIAGNOSIS — R350 Frequency of micturition: Secondary | ICD-10-CM | POA: Diagnosis not present

## 2024-01-09 DIAGNOSIS — R351 Nocturia: Secondary | ICD-10-CM | POA: Diagnosis not present

## 2024-01-09 DIAGNOSIS — N401 Enlarged prostate with lower urinary tract symptoms: Secondary | ICD-10-CM | POA: Diagnosis not present

## 2024-01-09 DIAGNOSIS — R3915 Urgency of urination: Secondary | ICD-10-CM | POA: Diagnosis not present

## 2024-01-29 ENCOUNTER — Encounter: Payer: Self-pay | Admitting: Gastroenterology

## 2024-01-29 ENCOUNTER — Encounter: Admitting: Family Medicine

## 2024-02-01 ENCOUNTER — Encounter: Payer: Self-pay | Admitting: Family Medicine

## 2024-02-01 ENCOUNTER — Ambulatory Visit: Payer: Self-pay | Admitting: Family Medicine

## 2024-02-01 ENCOUNTER — Ambulatory Visit: Admitting: Family Medicine

## 2024-02-01 VITALS — BP 134/60 | HR 69 | Temp 98.1°F | Ht 68.0 in | Wt 190.0 lb

## 2024-02-01 DIAGNOSIS — E785 Hyperlipidemia, unspecified: Secondary | ICD-10-CM | POA: Diagnosis not present

## 2024-02-01 DIAGNOSIS — E1165 Type 2 diabetes mellitus with hyperglycemia: Secondary | ICD-10-CM

## 2024-02-01 DIAGNOSIS — Z125 Encounter for screening for malignant neoplasm of prostate: Secondary | ICD-10-CM

## 2024-02-01 DIAGNOSIS — I10 Essential (primary) hypertension: Secondary | ICD-10-CM

## 2024-02-01 DIAGNOSIS — Z0000 Encounter for general adult medical examination without abnormal findings: Secondary | ICD-10-CM | POA: Diagnosis not present

## 2024-02-01 LAB — MICROALBUMIN / CREATININE URINE RATIO
Creatinine,U: 131.1 mg/dL
Microalb Creat Ratio: 44.7 mg/g — ABNORMAL HIGH (ref 0.0–30.0)
Microalb, Ur: 5.9 mg/dL — ABNORMAL HIGH (ref 0.0–1.9)

## 2024-02-01 LAB — CBC WITH DIFFERENTIAL/PLATELET
Basophils Absolute: 0 K/uL (ref 0.0–0.1)
Basophils Relative: 0.6 % (ref 0.0–3.0)
Eosinophils Absolute: 0.1 K/uL (ref 0.0–0.7)
Eosinophils Relative: 0.9 % (ref 0.0–5.0)
HCT: 48.2 % (ref 39.0–52.0)
Hemoglobin: 16.5 g/dL (ref 13.0–17.0)
Lymphocytes Relative: 22.2 % (ref 12.0–46.0)
Lymphs Abs: 1.8 K/uL (ref 0.7–4.0)
MCHC: 34.3 g/dL (ref 30.0–36.0)
MCV: 84.5 fl (ref 78.0–100.0)
Monocytes Absolute: 0.8 K/uL (ref 0.1–1.0)
Monocytes Relative: 10.3 % (ref 3.0–12.0)
Neutro Abs: 5.3 K/uL (ref 1.4–7.7)
Neutrophils Relative %: 66 % (ref 43.0–77.0)
Platelets: 299 K/uL (ref 150.0–400.0)
RBC: 5.71 Mil/uL (ref 4.22–5.81)
RDW: 14.5 % (ref 11.5–15.5)
WBC: 8 K/uL (ref 4.0–10.5)

## 2024-02-01 LAB — PSA, MEDICARE: PSA: 2.8 ng/mL (ref 0.10–4.00)

## 2024-02-01 LAB — COMPREHENSIVE METABOLIC PANEL WITH GFR
ALT: 11 U/L (ref 0–53)
AST: 12 U/L (ref 0–37)
Albumin: 4.7 g/dL (ref 3.5–5.2)
Alkaline Phosphatase: 70 U/L (ref 39–117)
BUN: 18 mg/dL (ref 6–23)
CO2: 33 meq/L — ABNORMAL HIGH (ref 19–32)
Calcium: 9.2 mg/dL (ref 8.4–10.5)
Chloride: 97 meq/L (ref 96–112)
Creatinine, Ser: 0.93 mg/dL (ref 0.40–1.50)
GFR: 84.78 mL/min (ref >60.00)
Glucose, Bld: 118 mg/dL — ABNORMAL HIGH (ref 70–99)
Potassium: 3.5 meq/L (ref 3.5–5.1)
Sodium: 140 meq/L (ref 135–145)
Total Bilirubin: 0.7 mg/dL (ref 0.2–1.2)
Total Protein: 7.3 g/dL (ref 6.0–8.3)

## 2024-02-01 LAB — LIPID PANEL
Cholesterol: 123 mg/dL (ref 0–200)
HDL: 39.9 mg/dL (ref >39.00)
LDL Cholesterol: 55 mg/dL (ref 0–99)
NonHDL: 83.05
Total CHOL/HDL Ratio: 3
Triglycerides: 142 mg/dL (ref 0.0–149.0)
VLDL: 28.4 mg/dL (ref 0.0–40.0)

## 2024-02-01 LAB — HEMOGLOBIN A1C: Hgb A1c MFr Bld: 6.5 % (ref 4.6–6.5)

## 2024-02-01 NOTE — Progress Notes (Signed)
 Established Patient Office Visit  Subjective   Patient ID: Tyrone Jefferson, male    DOB: 06/16/1955  Age: 68 y.o. MRN: [id number]  Chief Complaint  Patient presents with   Annual Exam    HPI   Tyrone Jefferson is seen today for physical exam.  He has chronic problems including hypertension, obstructive sleep apnea, type 2 diabetes, hyperlipidemia, BPH with nocturia.  Recently saw urologist.  Was prescribed Flomax but has not started yet.  He will to make sure there is no interaction with other medications.  Last spring he had urine microalbumin creatinine ratio 54.  We added Jardiance  to his regimen at that point.  He is taking this regularly and without side effects.  Generally doing well.  No specific complaints today.  Health maintenance reviewed:  Health Maintenance  Topic Date Due   DTaP/Tdap/Td (2 - Td or Tdap) 10/11/2021   COVID-19 Vaccine (4 - 2025-26 season) 10/22/2023   Colonoscopy  01/02/2024   Zoster Vaccines- Shingrix (1 of 2) 05/01/2024 (Originally 04/05/2006)   Influenza Vaccine  05/20/2024 (Originally 09/21/2023)   HEMOGLOBIN A1C  04/28/2024   Medicare Annual Wellness (AWV)  05/20/2024   Diabetic kidney evaluation - eGFR measurement  05/31/2024   OPHTHALMOLOGY EXAM  08/23/2024   Diabetic kidney evaluation - Urine ACR  10/29/2024   FOOT EXAM  01/31/2025   Pneumococcal Vaccine: 50+ Years  Completed   Hepatitis C Screening  Completed   Meningococcal B Vaccine  Aged Out   - He declines influenza and shingles vaccines -Due for 5-year repeat colonoscopy.  He plans to set this up  Family history-Father had colon cancer.  His mother had history of CAD in her late 12s and apparently some type of brain tumor-at age 66.  He had a brother with history of stroke age 35.  He is alive at age 85 now..   Social history-he retired 2020 from [employer].  Wife is also retired.  They have no children.  He has never smoked.  No alcohol.  Past Medical History:  Diagnosis Date    Chest pain    Dyspnea    Hyperlipidemia    Hypertension    Metabolic syndrome    OSA (obstructive sleep apnea)    Sleep apnea    Past Surgical History:  Procedure Laterality Date   BACK SURGERY  1992   SPINE SURGERY  1992   L4-5 discectomy    reports that he has never smoked. He has never used smokeless tobacco. He reports that he does not drink alcohol and does not use drugs. family history includes Brain cancer (age of onset: 66) in his mother; Colon cancer in his father; Diabetes in his mother; Heart attack (age of onset: 40) in his mother; Heart disease (age of onset: 34) in his father; Hypertension in his mother; Stroke in his brother. Allergies[1]  Review of Systems  Constitutional:  Negative for chills, fever, malaise/fatigue and weight loss.  HENT:  Negative for hearing loss.   Eyes:  Negative for blurred vision and double vision.  Respiratory:  Negative for cough and shortness of breath.   Cardiovascular:  Negative for chest pain, palpitations and leg swelling.  Gastrointestinal:  Negative for abdominal pain, blood in stool, constipation and diarrhea.  Genitourinary:  Negative for dysuria.  Skin:  Negative for rash.  Neurological:  Negative for dizziness, speech change, seizures, loss of consciousness and headaches.  Psychiatric/Behavioral:  Negative for depression.       Objective:  BP 134/60   Pulse 69   Temp 98.1 F (36.7 C) (Oral)   Ht 5' 8 (1.727 m)   Wt 190 lb (86.2 kg)   SpO2 96%   BMI 28.89 kg/m  BP Readings from Last 3 Encounters:  02/01/24 134/60  11/22/23 124/70  07/30/23 (!) 142/70   Wt Readings from Last 3 Encounters:  02/01/24 190 lb (86.2 kg)  11/22/23 195 lb 6.4 oz (88.6 kg)  07/30/23 196 lb 9.6 oz (89.2 kg)      Physical Exam Vitals reviewed.  Constitutional:      General: He is not in acute distress.    Appearance: He is well-developed. He is not ill-appearing.  HENT:     Head: Normocephalic and atraumatic.     Right Ear:  External ear normal.     Left Ear: External ear normal.  Eyes:     Conjunctiva/sclera: Conjunctivae normal.     Pupils: Pupils are equal, round, and reactive to light.  Neck:     Thyroid : No thyromegaly.  Cardiovascular:     Rate and Rhythm: Normal rate and regular rhythm.     Heart sounds: Normal heart sounds. No murmur heard. Pulmonary:     Effort: No respiratory distress.     Breath sounds: No wheezing or rales.  Abdominal:     General: Bowel sounds are normal. There is no distension.     Palpations: Abdomen is soft. There is no mass.     Tenderness: There is no abdominal tenderness. There is no guarding or rebound.  Musculoskeletal:     Cervical back: Normal range of motion and neck supple.  Lymphadenopathy:     Cervical: No cervical adenopathy.  Skin:    Findings: No rash.     Comments: Feet reveal no skin lesions. Good distal foot pulses. Good capillary refill. No calluses. Normal sensation with monofilament testing   Neurological:     Mental Status: He is alert and oriented to person, place, and time.     Cranial Nerves: No cranial nerve deficit.      No results found for any visits on 02/01/24.  Last CBC Lab Results  Component Value Date   WBC 8.8 01/24/2022   HGB 15.9 01/24/2022   HCT 45.0 01/24/2022   MCV 83.9 01/24/2022   RDW 13.2 01/24/2022   PLT 305.0 01/24/2022   Last metabolic panel Lab Results  Component Value Date   GLUCOSE 153 (H) 06/01/2023   NA 141 06/01/2023   K 3.7 06/01/2023   CL 96 06/01/2023   CO2 35 (H) 06/01/2023   BUN 17 06/01/2023   CREATININE 1.02 06/01/2023   GFR 76.24 06/01/2023   CALCIUM  9.7 06/01/2023   PROT 7.2 01/29/2023   ALBUMIN 4.6 01/29/2023   BILITOT 0.8 01/29/2023   ALKPHOS 64 01/29/2023   AST 14 01/29/2023   ALT 11 01/29/2023   Last lipids Lab Results  Component Value Date   CHOL 107 01/29/2023   HDL 35.30 (L) 01/29/2023   LDLCALC 45 01/29/2023   LDLDIRECT 69.0 04/20/2021   TRIG 137.0 01/29/2023    CHOLHDL 3 01/29/2023   Last hemoglobin A1c Lab Results  Component Value Date   HGBA1C 6.4 (A) 10/30/2023      The ASCVD Risk score (Arnett DK, et al., 2019) failed to calculate for the following reasons:   The valid total cholesterol range is 130 to 320 mg/dL    Assessment & Plan:   Problem List Items Addressed This Visit  Unprioritized   Type 2 diabetes mellitus with hyperglycemia (HCC) - Primary   Relevant Orders   Hemoglobin A1c   Microalbumin / creatinine urine ratio   Hypertension   Dyslipidemia   Relevant Orders   Lipid panel   CMP   Other Visit Diagnoses       Physical exam       Relevant Orders   CBC with Differential/Platelet     Prostate cancer screening       Relevant Orders   PSA, Medicare     68 year old male with chronic problems as above seen today for physical exam.  We discussed the following health maintenance items  -Consider Shingrix.  He declines at this time - Recommend he consider flu vaccine but he declines - He is due for repeat colonoscopy and he will set up - Obtain follow-up labs as above  No follow-ups on file.    Wolm Scarlet, MD     [1] No Known Allergies

## 2024-02-06 ENCOUNTER — Encounter: Payer: Self-pay | Admitting: Gastroenterology

## 2024-02-15 ENCOUNTER — Encounter: Payer: Self-pay | Admitting: Family Medicine

## 2024-02-15 ENCOUNTER — Telehealth: Payer: Self-pay | Admitting: *Deleted

## 2024-02-15 NOTE — Telephone Encounter (Signed)
 Copied from CRM #[id number]. Topic: Appointments - Scheduling Inquiry for Clinic >> Feb 15, 2024 11:45 AM Alfonso ORN wrote: Reason for CRM: pt mychart not allowing him to send messages to provider . Pt wants to know if A1c follow up appt should be a  3 month or 6 month for next appt. Please call pt back to advise

## 2024-02-17 NOTE — Telephone Encounter (Signed)
 Already answered in another note- 6 months OK  Wolm LELON Scarlet MD [facility]

## 2024-02-18 ENCOUNTER — Other Ambulatory Visit: Payer: Self-pay | Admitting: Family Medicine

## 2024-03-03 ENCOUNTER — Ambulatory Visit

## 2024-03-03 VITALS — Ht 68.0 in | Wt 192.4 lb

## 2024-03-03 DIAGNOSIS — Z860100 Personal history of colon polyps, unspecified: Secondary | ICD-10-CM

## 2024-03-03 MED ORDER — NA SULFATE-K SULFATE-MG SULF 17.5-3.13-1.6 GM/177ML PO SOLN: 1.0000 | Freq: Once | ORAL | 0 refills | Status: AC

## 2024-03-03 NOTE — Progress Notes (Signed)

## 2024-03-10 ENCOUNTER — Encounter: Payer: Self-pay | Admitting: Gastroenterology

## 2024-03-12 ENCOUNTER — Telehealth: Payer: Self-pay | Admitting: Gastroenterology

## 2024-03-12 NOTE — Telephone Encounter (Signed)
 New instructions sent to patients my chart account.

## 2024-03-12 NOTE — Telephone Encounter (Signed)
 Good morning Dr. Leigh,  We received a call from patient regarding upcoming colonoscopy on 03/19/2024. Patient rescheduled procedure due to work schedule. Patient rescheduled for 04/09/2024. Thank you.

## 2024-03-12 NOTE — Telephone Encounter (Signed)
 Okay thanks for letting me know

## 2024-03-12 NOTE — Telephone Encounter (Signed)
 Incoming call from pt rescheduling colonoscopy from 03/19/2024.  Pt scheduled for 04/09/2024 @2 :30pm. Pt in need of updated prep instructions. Please advise. Thank you.

## 2024-03-19 ENCOUNTER — Other Ambulatory Visit: Payer: Self-pay | Admitting: Family Medicine

## 2024-03-19 ENCOUNTER — Encounter: Admitting: Gastroenterology

## 2024-04-09 ENCOUNTER — Encounter: Admitting: Gastroenterology

## 2024-05-02 ENCOUNTER — Ambulatory Visit: Admitting: Family Medicine

## 2024-05-05 ENCOUNTER — Encounter: Admitting: Gastroenterology

## 2024-06-23 ENCOUNTER — Ambulatory Visit
